# Patient Record
Sex: Female | Born: 1975 | Race: White | Hispanic: No | Marital: Married | Smoking: Former smoker
Health system: Southern US, Community
[De-identification: ages and names within clinical notes are randomized; demographics above are authoritative.]

## PROBLEM LIST (undated history)

## (undated) DIAGNOSIS — A389 Scarlet fever, uncomplicated: Secondary | ICD-10-CM

## (undated) DIAGNOSIS — G43909 Migraine, unspecified, not intractable, without status migrainosus: Secondary | ICD-10-CM

## (undated) HISTORY — PX: WRIST SURGERY: SHX841

## (undated) HISTORY — DX: Migraine, unspecified, not intractable, without status migrainosus: G43.909

## (undated) HISTORY — PX: WISDOM TOOTH EXTRACTION: SHX21

## (undated) HISTORY — PX: OTHER SURGICAL HISTORY: SHX169

## (undated) HISTORY — PX: SHOULDER SURGERY: SHX246

## (undated) HISTORY — PX: BREAST ENHANCEMENT SURGERY: SHX7

## (undated) HISTORY — PX: BACK SURGERY: SHX140

---

## 2008-07-09 HISTORY — PX: BACK SURGERY: SHX140

## 2009-03-10 ENCOUNTER — Ambulatory Visit (HOSPITAL_COMMUNITY): Admission: RE | Admit: 2009-03-10 | Discharge: 2009-03-11 | Payer: Self-pay | Admitting: Neurological Surgery

## 2010-10-13 LAB — CBC
HCT: 41.6 % (ref 36.0–46.0)
Hemoglobin: 14.3 g/dL (ref 12.0–15.0)
MCHC: 34.4 g/dL (ref 30.0–36.0)
MCV: 100.5 fL — ABNORMAL HIGH (ref 78.0–100.0)
RBC: 4.14 MIL/uL (ref 3.87–5.11)
WBC: 6.8 10*3/uL (ref 4.0–10.5)

## 2010-11-21 NOTE — Op Note (Signed)
NAME:  Cristina Anderson, Cristina Anderson               ACCOUNT NO.:  [id number]   MEDICAL RECORD NO.:  [id number]          PATIENT TYPE:  OIB   LOCATION:  3041                         FACILITY:  [facility]   PHYSICIAN:  Stefani Dama, M.D.  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   PROCEDURES:  Microdiskectomy via Metrix procedure at L4-L5 on the left  with operating microscope and microdissection technique.   SURGEON:  Stefani Dama, MD   FIRST ASSISTANT:  Cristi Loron, MD   ANESTHESIA:  General endotracheal.   INDICATIONS:  Ms. Jashawna is a 35 year old individual who has had a  number of months of unrelenting back and left lower extremity pain, then  she has developed some weakness in the tibialis anterior group and she  has evidence of a large extruded fragment of disk at L4-L5, that has a  fragment behind the body of L5.  She has been suffering with this  despite conservative efforts.  She is not getting any better and she was  advised regarding surgical decompression of disk at L4-L5 on the left  side.   PROCEDURE:  The patient was brought to the operating room supine on the  stretcher.  After smooth induction of general endotracheal anesthesia,  she was turned prone onto the operating table.  Back was prepped with  alcohol and DuraPrep and draped in a sterile fashion.  Fluoroscopic  guidance was used to localize the L4-L5 level on the right side and then  the skin above this area was infiltrated with a mixture of 1% lidocaine  with epinephrine 1:100,000 mixed 50/50 with 0.5% of Marcaine, a total of  10 mL volume was used.  A small vertical incision was created in the  skin and then a K-wire  was passed down to the level of the L4 laminar  arch on the right side.  Using a series of dilators, a winding technique  was then used to create the space at the L4-L5 interspace on the right  side out to the 18 mm diameter, 4 cm deep.  Endoscopic cannula was then  fitted in this region and the inner cannulas were removed.  The lamina  of L4 was then cleansed and removed using a high-speed drill and  electrocautery to maintain hemostasis.  The yellow ligament was found,  taken up with a 2 and 3 mm Kerrison punch.  Then, the medial aspect of  the superior articular process was removed from L5 and underneath this  the common dural tube and takeoff of the L5 nerve root was identified by  dissecting medial to this.  The nerve was noted to be taut over  significant mass and by moving it medially.  The mass could be  identified.  There were fragments of disk herniation.  These fragments  were removed  in a piecemeal fashion.  Several small to medium sized  fragments were removed and then gradually the nerve could be mobilized  further.  The diskectomy continued until the medial-most portion of this  could be identified and then opening into the disk space was identified  also.  The opening was enlarged slightly and there was noted to be a  moderate amount of significantly degenerated disk material within the  disk space.  This was then carefully dissected using a series of  curettes and rongeurs to free the disk both medially and laterally and  then went into the depths of the diskectomy void.  Once this was  completely finished and no further disk material could be obtained, the  area was irrigated copiously.  The integrity of the L5 nerve root and  the common dural tube was maintained and this was carefully checked for  hemostasis.  No CSF leaks had occurred with good hemostasis and the soft  tissues around the interspace and above the laminotomy site.  The  retractor was removed.  Microscope  was removed.  The lumbodorsal fascia  was secured with 3-0 Vicryl and 3-0 Vicryl was used on the subcutaneous  subcuticular tissues to close the skin.  Blood loss for the procedure  was estimated less than 20 mL.  The patient tolerated the procedure well  and was returned to the recovery room in stable condition.      Stefani Dama, M.D.  Electronically Signed     HJE/MEDQ  D:  03/10/2009  T:  03/11/2009  Job:  [id number]

## 2014-05-27 ENCOUNTER — Encounter (HOSPITAL_COMMUNITY): Payer: Self-pay | Admitting: *Deleted

## 2014-05-27 ENCOUNTER — Emergency Department (HOSPITAL_COMMUNITY): Payer: 59

## 2014-05-27 ENCOUNTER — Emergency Department (HOSPITAL_COMMUNITY)
Admission: EM | Admit: 2014-05-27 | Discharge: 2014-05-28 | Disposition: A | Discharge status: Home / Self Care | Payer: 59 | Attending: Emergency Medicine | Admitting: Emergency Medicine

## 2014-05-27 DIAGNOSIS — Z87891 Personal history of nicotine dependence: Secondary | ICD-10-CM | POA: Diagnosis not present

## 2014-05-27 DIAGNOSIS — R55 Syncope and collapse: Secondary | ICD-10-CM | POA: Diagnosis present

## 2014-05-27 DIAGNOSIS — R61 Generalized hyperhidrosis: Secondary | ICD-10-CM | POA: Insufficient documentation

## 2014-05-27 DIAGNOSIS — Z8619 Personal history of other infectious and parasitic diseases: Secondary | ICD-10-CM | POA: Insufficient documentation

## 2014-05-27 DIAGNOSIS — Z3202 Encounter for pregnancy test, result negative: Secondary | ICD-10-CM | POA: Insufficient documentation

## 2014-05-27 DIAGNOSIS — R079 Chest pain, unspecified: Secondary | ICD-10-CM

## 2014-05-27 HISTORY — DX: Scarlet fever, uncomplicated: A38.9

## 2014-05-27 LAB — CBC
HCT: 37.3 % (ref 36.0–46.0)
Hemoglobin: 12.5 g/dL (ref 12.0–15.0)
MCH: 32 pg (ref 26.0–34.0)
MCHC: 33.5 g/dL (ref 30.0–36.0)
MCV: 95.4 fL (ref 78.0–100.0)
PLATELETS: 158 10*3/uL (ref 150–400)
RBC: 3.91 MIL/uL (ref 3.87–5.11)
RDW: 12.4 % (ref 11.5–15.5)
WBC: 6.4 10*3/uL (ref 4.0–10.5)

## 2014-05-27 LAB — BASIC METABOLIC PANEL
ANION GAP: 16 — AB (ref 5–15)
BUN: 15 mg/dL (ref 6–23)
CALCIUM: 9.2 mg/dL (ref 8.4–10.5)
CHLORIDE: 103 meq/L (ref 96–112)
CO2: 22 meq/L (ref 19–32)
Creatinine, Ser: 0.83 mg/dL (ref 0.50–1.10)
GFR calc non Af Amer: 88 mL/min — ABNORMAL LOW (ref >90)
Glucose, Bld: 102 mg/dL — ABNORMAL HIGH (ref 70–99)
Potassium: 3.9 mEq/L (ref 3.7–5.3)
SODIUM: 141 meq/L (ref 137–147)

## 2014-05-27 LAB — URINALYSIS, ROUTINE W REFLEX MICROSCOPIC
Bilirubin Urine: NEGATIVE
Glucose, UA: NEGATIVE mg/dL
HGB URINE DIPSTICK: NEGATIVE
KETONES UR: NEGATIVE mg/dL
Leukocytes, UA: NEGATIVE
Nitrite: NEGATIVE
PROTEIN: NEGATIVE mg/dL
Specific Gravity, Urine: 1.021 (ref 1.005–1.030)
UROBILINOGEN UA: 0.2 mg/dL (ref 0.0–1.0)
pH: 6 (ref 5.0–8.0)

## 2014-05-27 LAB — CBG MONITORING, ED: Glucose-Capillary: 115 mg/dL — ABNORMAL HIGH (ref 70–99)

## 2014-05-27 LAB — PREGNANCY, URINE: Preg Test, Ur: NEGATIVE

## 2014-05-27 LAB — TROPONIN I

## 2014-05-27 NOTE — ED Notes (Addendum)
Pt states sent here from UC for near-sycope today while sitting at desk.  States dizziness lasting 3 - 4 hours with bil arm numbness and a flushed feeling.  States UC said her bp was 155 systolic.  Pt states her arms are no longer numb and she is no longer dizzy.   Also c/o intermittent L chest pain.

## 2014-05-27 NOTE — ED Notes (Signed)
Pt from home for c/o near syncopal episode while at work, pt states she got diaphoretic, sob and had bilateral arm numbness. Denies any symptoms at this time. nad noted. Pt axo x4.

## 2014-05-27 NOTE — Discharge Instructions (Signed)
Please follow-up with a primary care physician in the area-- resource guide attached. Return to the ED for new or worsening symptoms.   Emergency Department Resource Guide 1) Find a Doctor and Pay Out of Pocket Although you won't have to find out who is covered by your insurance plan, it is a good idea to ask around and get recommendations. You will then need to call the office and see if the doctor you have chosen will accept you as a new patient and what types of options they offer for patients who are self-pay. Some doctors offer discounts or will set up payment plans for their patients who do not have insurance, but you will need to ask so you aren't surprised when you get to your appointment.  2) Contact Your Local Health Department Not all health departments have doctors that can see patients for sick visits, but many do, so it is worth a call to see if yours does. If you don't know where your local health department is, you can check in your phone book. The CDC also has a tool to help you locate your state's health department, and many state websites also have listings of all of their local health departments.  3) Find a Walk-in Clinic If your illness is not likely to be very severe or complicated, you may want to try a walk in clinic. These are popping up all over the country in pharmacies, drugstores, and shopping centers. They're usually staffed by nurse practitioners or physician assistants that have been trained to treat common illnesses and complaints. They're usually fairly quick and inexpensive. However, if you have serious medical issues or chronic medical problems, these are probably not your best option.  No Primary Care Doctor: - Call Health Connect at  [phone] - they can help you locate a primary care doctor that  accepts your insurance, provides certain services, etc. - Physician Referral Service- [phone]  Chronic Pain Problems: Organization         Address  Phone    Notes  [facility]  [phone] Patients need to be referred by their primary care doctor.   Medication Assistance: Organization         Address  Phone   Notes  [facility] Medication [facility] [address] [place], [place] [address] [phone] --Must be a resident of [place] -- Must have NO insurance coverage whatsoever (no Medicaid/ Medicare, etc.) -- The pt. MUST have a primary care doctor that directs their care regularly and follows them in the community   MedAssist  [phone]   [employer]  [phone]    Agencies that provide inexpensive medical care: Organization         Address  Phone   Notes  [facility]  [phone]   [facility]    [phone]   [facility] [address] [place], [place] [address] [phone]   [facility] [address], [place] [phone]   [facility]anned [facility]    [phone]   [facility]    [phone]   [facility]  [address], [place] Phone:  [phone], Fax:  [phone]) [phone] Hours of Operation:  9 am - 6 pm, M-F.  Also accepts Medicaid/Medicare and self-pay.  [facility]  [address], [place] Phone: [phone],  Fax: [phone]. Hours of Operation:  8:30 am - 5:30 pm, M-F.  Also accepts Medicaid and self-pay.  [facility] [address], [place] Phone: [phone]   [place], [place], [place] [phone], Ext. [phone] Mondays & Thursdays: 7-9 AM.  First 15 patients are seen on a first come, first serve basis.    [place] Providers:  Organization         Address  Phone   Notes  [facility] [address],  [phone] Also accepts self-pay patients.  [facility]  [address], [place]  [phone]   [place], [address], [place] [phone]   [facility] [address], [place] [phone]   Lucianne Lei [address], [place]   [phone] Only accepts [place] Access [place] patients after they have their name applied to their card.   Self-Pay (no insurance) in [facility]:  Organization         Address  Phone   Notes  Sickle Cell Patients, [facility] [place] [phone]   [facility] [place] [phone]   [facility] [place]  [place], [place], [place] [phone]   [facility]/Dr. Osei-Bonsu  [address], [place] or [address], [place] [phone] Phone number for both [place] and [place] locations is the same.  [facility] [address], [place] [phone]   [facility] [address] [place] or [address] [phone]   [facility] [address] [place] [phone], phone; [phone], fax Sees patients 1st and 3rd Saturday of every month.  Must not qualify for public or private insurance (i.e. Medicaid, Medicare, [place] Health Choice, Veterans' Benefits)  Household income should be no more than 200% of the poverty level The clinic cannot treat you if you are pregnant or think you are pregnant  Sexually transmitted diseases are not treated at the clinic.    Dental Care: Organization         Address  Phone  Notes  [facility] Department of [facility] [phone] Accepts children up to age 22 who are enrolled in [place] or [place]; pregnant women with a Medicaid card; and children who have  applied for Medicaid or [place] Health Choice, but were declined, whose parents can pay a reduced fee at time of service.  [facility] Department of [facility]  [address], [place] [phone] Accepts children up to age 33 who are enrolled in [place] or [place]; pregnant women with a Medicaid card; and children who have applied for Medicaid or [place] Health Choice, but were declined, whose parents can pay a reduced fee at time of service.  [place] Adult Dental Access PROGRAM  [place] [phone] Patients are seen by appointment only. Walk-ins are not accepted. [place] will see patients 13 years of age and older. Monday - Tuesday (8am-5pm) Most Wednesdays (8:30-5pm) $30 per visit, cash only  [place] [facility] [employer] PROGRAM  [address], [employer] (  [phone] Patients are seen by appointment only. Walk-ins are not accepted. [place] will see patients 46 years of age and older. One Wednesday Evening (Monthly: Volunteer Based).  $30 per visit, cash only  [place]  [phone] for adults; Children under age 38, call Graduate Pediatric Dentistry at [phone]. Children aged 28-14, please call [phone] to request a pediatric application.  Dental services are provided in all areas of dental care including fillings, crowns and bridges, complete and partial dentures, implants, gum treatment, root canals, and extractions. Preventive care is also provided. Treatment is provided to both adults and children. Patients are selected via a lottery and there is often a waiting list.   [facility] [address] [place]  [phone] [email or web address]   [facility] [address] [place], [place] [phone], Ext. [phone] Second and Fourth Thursday of each month, opens at 6:30 AM; Clinic ends at 9 AM.  Patients are seen on a first-come first-served basis, and a  limited number are seen during each clinic.   [facility]  [address] Hill[place] D[place]er [place], [place] [phone]   Eligibility Requirements You must have lived in [place], [place], or [place] counties for at least the last three months.   You cannot be eligible for state or federal sponsored [employer], including [employer], [place], or [employer].   You generally cannot be eligible for healthcare insurance through your employer.    How to apply: Eligibility screenings are held every Tuesday and Wednesday afternoon from 1:00 pm until 4:00 pm. You do not need an appointment for the interview!  [facility] [address], [place], [place]   [place]  [place] Department  [place]  [phone]    Behavioral Health Resources in the Community: Intensive Outpatient Programs Organization         Address  Phone  Notes  [place]. [address], [place], [place] [phone]   [facility] [address] [place], [place]   ADS: [facility] Drug S[facility] [address] [place], [place]   [place] [address],  [place], [place] or [phone]   Substance Abuse Resources Organization         Address  Phone  Notes  Alcohol and Drug Services  [phone]   [place]  [phone]   [facility] [place]   Chinita Pester  [phone]   Residential & Outpatient Substance Abuse Program  [phone]   Psychological Services Organization         Address  Phone  Notes  [facility]  [place]  [phone]   [place] [address] [place] or [phone]    Mobile Crisis Teams Organization          Address  Phone  Notes  [facility], Mobile Crisis Care Unit  [phone]   [facility]  [address] [place], [place]   [place] Levels [address], [place] [phone]    Self-Help/Support Groups Organization         Address  Phone             Notes  [place]. of [place] - variety of support groups  336- H3156881 Call for more information  Narcotics Anonymous (NA), [facility] [address], [employer] [place]  2 meetings at this location   Residential Facilities manager         Address  Phone  Notes  [facility] [place],    [place]  [phone]   [facility]  [address], [place] [id number], [place], [place]   [place], [place] [phone] Admissions: 8am-3pm M-F  Incentives Substance [place] [address]    [employer], [place] [phone]   [facility] [address] [place], [place], [place]   [facility] [address],  [place], [place]   [facility] Programs - Intensive Outpatient [address], [place], [place]   [facility] ([place].,  [place], [place] [phone] or [phone]   [facility] Services (RTS) [address], [place], [place] Accepts Medicaid  [facility] [address]  [place] [phone] Substance Abuse/Addiction Treatment   [facility] Organization         Address  Phone  Notes  [facility]  [phone]   Domenic Schwab, PhD [address] Arlis Porta [place], [place]   [phone] or [phone]   [place], [place] [phone]   [facility] [address], [place], [place] [phone] Insurance/Medicaid/sponsorship  through [facility] and Families [address]                                    [place], [place] [phone] [place] [address], [place] [phone]    Dr. Adele Schilder  [phone]   [facility] Dept. 1) [address] [place] 2) [place] 3)  [place] [address], [place] [phone]  [phone]   [place] [phone] or [phone] (After Hours)

## 2014-05-27 NOTE — ED Provider Notes (Signed)
CSN: [id number]     Arrival date & time 05/27/14  1734 History   First MD Initiated Contact with Patient 05/27/14 2209     Chief Complaint  Patient presents with  . Near Syncope     (Consider location/radiation/quality/duration/timing/severity/associated sxs/prior Treatment) Patient is a 38 y.o. female presenting with near-syncope. The history is provided by the patient and medical records.  Near Syncope   This is a 38 year old female with past medical history significant for scarlet fever, worsening to the ED for near syncope. 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 States beginning at 9 AM she had an episode of bilateral arm paresthesias, flushed sensation, and lightheadedness. She did not lose consciousness. She ate a cupcake with improvement of her symptoms. States she had 2 other episodes of this similar symptoms earlier this afternoon. She was seen at an urgent care in [place] and encouraged to come to the ED for further evaluation. States she has been asymptomatic for the past several hours since arriving at the ED. Patient has had issues with hypoglycemia in the past with similar symptoms. She did not eat breakfast this morning and drank a monster energy drink.  She states she has had some intermittent left-sided chest pain over the past year.  When pain occurs, localized under her left breast, lasting 5-10 seconds at a time. She denies any episodes of chest pain, shortness of breath, numbness, weakness, dizziness, ataxia, visual disturbance, or changes in speech today.  Patient is not currently on any home medications. Vital signs stable on arrival.  Past Medical History  Diagnosis Date  . Scarlet fever    Past Surgical History  Procedure Laterality Date  . Back surgery      lumbar repair   No family history on file. History  Substance Use Topics  . Smoking status: Former Smoker    Types: Cigarettes  . Smokeless tobacco: Not on file  . Alcohol  Use: Yes   OB History    No data available     Review of Systems  Cardiovascular: Positive for near-syncope.  Neurological: Positive for light-headedness (resolved).       Paresthesias, resolved  All other systems reviewed and are negative.   Allergies  Mobic and Red dye  Home Medications   Prior to Admission medications   Not on File   BP 112/69 mmHg  Pulse 64  Temp(Src) 98.2 F (36.8 C) (Oral)  Resp 18  Ht 5\' 4"  (1.626 m)  Wt 137 lb (62.143 kg)  BMI 23.50 kg/m2  SpO2 100%  LMP 04/26/2014   Physical Exam  Constitutional: She is oriented to person, place, and time. She appears well-developed and well-nourished. No distress.  HENT:  Head: Normocephalic and atraumatic.  Mouth/Throat: Oropharynx is clear and moist.  Eyes: Conjunctivae and EOM are normal. Pupils are equal, round, and reactive to light.  Neck: Normal range of motion and full passive range of motion without pain. Neck supple. No spinous process tenderness present. No rigidity. No edema present.  No meningismus  Cardiovascular: Normal rate, regular rhythm and normal heart sounds.   Pulmonary/Chest: Effort normal and breath sounds normal. No respiratory distress. She has no wheezes.  Abdominal: Soft. Bowel sounds are normal. There is no tenderness. There is no guarding.  Musculoskeletal: Normal range of motion.       Cervical back: Normal.  CS non-tender, no deformities, full ROM  Neurological: She is alert and oriented to person, place, and time.  AAOx3,  answering questions appropriately; equal strength UE and LE bilaterally; CN grossly intact; moves all extremities appropriately without ataxia; normal coordination bilaterally; no focal neuro deficits or facial asymmetry appreciated; ambulating with steady gait  Skin: Skin is warm and dry. She is not diaphoretic.  Psychiatric: She has a normal mood and affect.  Nursing note and vitals reviewed.   ED Course  Procedures (including critical care  time) Labs Review Labs Reviewed  BASIC METABOLIC PANEL - Abnormal; Notable for the following:    Glucose, Bld 102 (*)    GFR calc non Af Amer 88 (*)    Anion gap 16 (*)    All other components within normal limits  CBG MONITORING, ED - Abnormal; Notable for the following:    Glucose-Capillary 115 (*)    All other components within normal limits  CBC  TROPONIN I  PREGNANCY, URINE  URINALYSIS, ROUTINE W REFLEX MICROSCOPIC    Imaging Review Dg Chest 2 View  05/27/2014   CLINICAL DATA:  Near syncope today. Dizziness and nausea. Bilateral arm numbness. Intermittent left chest pain. Shortness of breath.  EXAM: CHEST  2 VIEW  COMPARISON:  None.  FINDINGS: The heart size and mediastinal contours are within normal limits. Both lungs are clear. The visualized skeletal structures are unremarkable.  IMPRESSION: Normal exam.   Electronically Signed   By: Geanie CooleyJim  Maxwell M.D.   On: 05/27/2014 19:50     EKG Interpretation   Date/Time:  Thursday May 27 2014 18:17:52 EST Ventricular Rate:  87 PR Interval:  110 QRS Duration: 84 QT Interval:  390 QTC Calculation: 469 R Axis:   77 Text Interpretation:  Sinus rhythm with sinus arrhythmia with short PR  Nonspecific ST changes Abnormal ECG No old tracing to compare Confirmed by  WARD,  DO, KRISTEN ([id number]) on 05/27/2014 10:44:36 PM      MDM   Final diagnoses:  Near syncope   38 year old female with multiple episodes of near syncope earlier today at work associated with bilateral arm paresthesias, lightheadedness, and diaphoresis. She did not eat breakfast and drank a monster energy drink. Symptoms improved after eating a cupcake. On arrival, patient is currently asymptomatic. Her neurologic exam is nonfocal.  Neck is non-tender, no reported injuries and no deformities on exam.  No meningismus.  EKG NSR without ischemic changes.  Trop negative.  Lab work reassuring.  CXR clear.  No risk factors for PE, patient PERC negative.  Patient remains  asymptomatic while in the ED and is ready to go home, feel this is appropriate. VS remain stable.  Low suspicion for neurologic or cardiac etiology of her syncope. Possible episodic hypoglycemia given that she did not eat breakfast and that symptoms improved after eating a cupcake. Encouraged close FU with PCP in the area, resource guide given.  Discussed plan with patient, he/she acknowledged understanding and agreed with plan of care.  Return precautions given for new or worsening symptoms.  Case discussed with attending physician, Dr. Elesa MassedWard, who agrees with assessment and plan of care.  Garlon HatchetLisa M Frimet Durfee, PA-C 05/27/14 2357  Garlon HatchetLisa M Marcey Persad, PA-C 05/28/14 0001  Layla MawKristen N Ward, DO 05/28/14 1555

## 2015-01-23 ENCOUNTER — Emergency Department (HOSPITAL_COMMUNITY)
Admission: EM | Admit: 2015-01-23 | Discharge: 2015-01-23 | Disposition: A | Discharge status: Home / Self Care | Payer: BLUE CROSS/BLUE SHIELD | Attending: Emergency Medicine | Admitting: Emergency Medicine

## 2015-01-23 ENCOUNTER — Emergency Department (HOSPITAL_COMMUNITY): Payer: BLUE CROSS/BLUE SHIELD

## 2015-01-23 DIAGNOSIS — R102 Pelvic and perineal pain unspecified side: Secondary | ICD-10-CM

## 2015-01-23 DIAGNOSIS — Z3202 Encounter for pregnancy test, result negative: Secondary | ICD-10-CM | POA: Insufficient documentation

## 2015-01-23 DIAGNOSIS — Z87891 Personal history of nicotine dependence: Secondary | ICD-10-CM | POA: Diagnosis not present

## 2015-01-23 DIAGNOSIS — Z8619 Personal history of other infectious and parasitic diseases: Secondary | ICD-10-CM | POA: Diagnosis not present

## 2015-01-23 DIAGNOSIS — R109 Unspecified abdominal pain: Secondary | ICD-10-CM

## 2015-01-23 DIAGNOSIS — R1011 Right upper quadrant pain: Secondary | ICD-10-CM | POA: Diagnosis present

## 2015-01-23 LAB — COMPREHENSIVE METABOLIC PANEL
ALBUMIN: 4.2 g/dL (ref 3.5–5.0)
ALK PHOS: 39 U/L (ref 38–126)
ALT: 12 U/L — ABNORMAL LOW (ref 14–54)
ANION GAP: 7 (ref 5–15)
AST: 19 U/L (ref 15–41)
BILIRUBIN TOTAL: 0.4 mg/dL (ref 0.3–1.2)
BUN: 18 mg/dL (ref 6–20)
CHLORIDE: 107 mmol/L (ref 101–111)
CO2: 25 mmol/L (ref 22–32)
CREATININE: 0.89 mg/dL (ref 0.44–1.00)
Calcium: 8.9 mg/dL (ref 8.9–10.3)
GFR calc non Af Amer: >60 mL/min (ref >60)
Glucose, Bld: 109 mg/dL — ABNORMAL HIGH (ref 65–99)
Potassium: 4 mmol/L (ref 3.5–5.1)
Sodium: 139 mmol/L (ref 135–145)
Total Protein: 7.4 g/dL (ref 6.5–8.1)

## 2015-01-23 LAB — CBC
HCT: 34.9 % — ABNORMAL LOW (ref 36.0–46.0)
Hemoglobin: 11.7 g/dL — ABNORMAL LOW (ref 12.0–15.0)
MCH: 33 pg (ref 26.0–34.0)
MCHC: 33.5 g/dL (ref 30.0–36.0)
MCV: 98.3 fL (ref 78.0–100.0)
PLATELETS: 154 10*3/uL (ref 150–400)
RBC: 3.55 MIL/uL — ABNORMAL LOW (ref 3.87–5.11)
RDW: 12.8 % (ref 11.5–15.5)
WBC: 9.7 10*3/uL (ref 4.0–10.5)

## 2015-01-23 LAB — URINALYSIS, ROUTINE W REFLEX MICROSCOPIC
Bilirubin Urine: NEGATIVE
GLUCOSE, UA: NEGATIVE mg/dL
HGB URINE DIPSTICK: NEGATIVE
KETONES UR: NEGATIVE mg/dL
LEUKOCYTES UA: NEGATIVE
NITRITE: NEGATIVE
PROTEIN: NEGATIVE mg/dL
SPECIFIC GRAVITY, URINE: 1.019 (ref 1.005–1.030)
Urobilinogen, UA: 0.2 mg/dL (ref 0.0–1.0)
pH: 6 (ref 5.0–8.0)

## 2015-01-23 LAB — I-STAT BETA HCG BLOOD, ED (MC, WL, AP ONLY): I-stat hCG, quantitative: <5 m[IU]/mL (ref <5)

## 2015-01-23 LAB — LIPASE, BLOOD: LIPASE: 20 U/L — AB (ref 22–51)

## 2015-01-23 MED ORDER — SODIUM CHLORIDE 0.9 % IV SOLN: INTRAVENOUS | Status: DC

## 2015-01-23 MED ORDER — HYDROMORPHONE HCL 1 MG/ML IJ SOLN
1.0000 mg | Freq: Once | INTRAMUSCULAR | Status: AC
  Administered 2015-01-23: 1 mg via INTRAVENOUS
  Filled 2015-01-23: qty 1

## 2015-01-23 MED ORDER — OXYCODONE-ACETAMINOPHEN 5-325 MG PO TABS: 1.0000 | ORAL_TABLET | ORAL | Status: DC | PRN

## 2015-01-23 MED ORDER — ONDANSETRON HCL 4 MG/2ML IJ SOLN
4.0000 mg | Freq: Once | INTRAMUSCULAR | Status: AC
  Administered 2015-01-23: 4 mg via INTRAVENOUS
  Filled 2015-01-23: qty 2

## 2015-01-23 MED ORDER — SODIUM CHLORIDE 0.9 % IV BOLUS (SEPSIS)
1000.0000 mL | Freq: Once | INTRAVENOUS | Status: AC
  Administered 2015-01-23: 1000 mL via INTRAVENOUS

## 2015-01-23 MED ORDER — IOHEXOL 300 MG/ML  SOLN
50.0000 mL | Freq: Once | INTRAMUSCULAR | Status: AC | PRN
  Administered 2015-01-23: 50 mL via ORAL

## 2015-01-23 MED ORDER — IOHEXOL 300 MG/ML  SOLN
100.0000 mL | Freq: Once | INTRAMUSCULAR | Status: AC | PRN
  Administered 2015-01-23: 100 mL via INTRAVENOUS

## 2015-01-23 NOTE — ED Notes (Signed)
Patient transported to Ultrasound 

## 2015-01-23 NOTE — Discharge Instructions (Signed)
You had a cyst on your ovary today that ruptured and has caused bleeding. You will need to be seen by a gynecologist within the next 4 weeks. Go to [facility] if you become dizzy or lightheaded or if the pain becomes worse Ovarian Cyst An ovarian cyst is a fluid-filled sac that forms on an ovary. The ovaries are small organs that produce eggs in women. Various types of cysts can form on the ovaries. Most are not cancerous. Many do not cause problems, and they often go away on their own. Some may cause symptoms and require treatment. Common types of ovarian cysts include:  Functional cysts--These cysts may occur every month during the menstrual cycle. This is normal. The cysts usually go away with the next menstrual cycle if the woman does not get pregnant. Usually, there are no symptoms with a functional cyst.  Endometrioma cysts--These cysts form from the tissue that lines the uterus. They are also called "chocolate cysts" because they become filled with blood that turns brown. This type of cyst can cause pain in the lower abdomen during intercourse and with your menstrual period.  Cystadenoma cysts--This type develops from the cells on the outside of the ovary. These cysts can get very big and cause lower abdomen pain and pain with intercourse. This type of cyst can twist on itself, cut off its blood supply, and cause severe pain. It can also easily rupture and cause a lot of pain.  Dermoid cysts--This type of cyst is sometimes found in both ovaries. These cysts may contain different kinds of body tissue, such as skin, teeth, hair, or cartilage. They usually do not cause symptoms unless they get very big.  Theca lutein cysts--These cysts occur when too much of a certain hormone (human chorionic gonadotropin) is produced and overstimulates the ovaries to produce an egg. This is most common after procedures used to assist with the conception of a baby (in vitro fertilization). CAUSES   Fertility  drugs can cause a condition in which multiple large cysts are formed on the ovaries. This is called ovarian hyperstimulation syndrome.  A condition called polycystic ovary syndrome can cause hormonal imbalances that can lead to nonfunctional ovarian cysts. SIGNS AND SYMPTOMS  Many ovarian cysts do not cause symptoms. If symptoms are present, they may include:  Pelvic pain or pressure.  Pain in the lower abdomen.  Pain during sexual intercourse.  Increasing girth (swelling) of the abdomen.  Abnormal menstrual periods.  Increasing pain with menstrual periods.  Stopping having menstrual periods without being pregnant. DIAGNOSIS  These cysts are commonly found during a routine or annual pelvic exam. Tests may be ordered to find out more about the cyst. These tests may include:  Ultrasound.  X-ray of the pelvis.  CT scan.  MRI.  Blood tests. TREATMENT  Many ovarian cysts go away on their own without treatment. Your health care provider may want to check your cyst regularly for 2-3 months to see if it changes. For women in menopause, it is particularly important to monitor a cyst closely because of the higher rate of ovarian cancer in menopausal women. When treatment is needed, it may include any of the following:  A procedure to drain the cyst (aspiration). This may be done using a long needle and ultrasound. It can also be done through a laparoscopic procedure. This involves using a thin, lighted tube with a tiny camera on the end (laparoscope) inserted through a small incision.  Surgery to remove the whole cyst.  This may be done using laparoscopic surgery or an open surgery involving a larger incision in the lower abdomen.  Hormone treatment or birth control pills. These methods are sometimes used to help dissolve a cyst. HOME CARE INSTRUCTIONS   Only take over-the-counter or prescription medicines as directed by your health care provider.  Follow up with your health care  provider as directed.  Get regular pelvic exams and Pap tests. SEEK MEDICAL CARE IF:   Your periods are late, irregular, or painful, or they stop.  Your pelvic pain or abdominal pain does not go away.  Your abdomen becomes larger or swollen.  You have pressure on your bladder or trouble emptying your bladder completely.  You have pain during sexual intercourse.  You have feelings of fullness, pressure, or discomfort in your stomach.  You lose weight for no apparent reason.  You feel generally ill.  You become constipated.  You lose your appetite.  You develop acne.  You have an increase in body and facial hair.  You are gaining weight, without changing your exercise and eating habits.  You think you are pregnant. SEEK IMMEDIATE MEDICAL CARE IF:   You have increasing abdominal pain.  You feel sick to your stomach (nauseous), and you throw up (vomit).  You develop a fever that comes on suddenly.  You have abdominal pain during a bowel movement.  Your menstrual periods become heavier than usual. MAKE SURE YOU:  Understand these instructions.  Will watch your condition.  Will get help right away if you are not doing well or get worse. Document Released: 06/25/2005 Document Revised: 06/30/2013 Document Reviewed: 03/02/2013 [facility] Patient Information 2015 [place], [place]. This information is not intended to replace advice given to you by your health care provider. Make sure you discuss any questions you have with your health care provider.

## 2015-01-23 NOTE — ED Provider Notes (Addendum)
CSN: [id number]     Arrival date & time 01/23/15  7829 History   First MD Initiated Contact with Patient 01/23/15 [phone]     Chief Complaint  Patient presents with  . Abdominal Pain     (Consider location/radiation/quality/duration/timing/severity/associated sxs/prior Treatment) HPI Comments: Patient here complaining of 24 hours of abdominal pain that started her right upper quadrant that is now migrated to her right lower quadrant. No associated fever or chills. No dysuria or hematuria. No vaginal bleeding or discharge. Took antiacids without relief. No prior history of same. Pain characterized as sharp and worse with movements. Patient states that she said down the toilet she tried to move her bowels the pain was so severe that she had a brief syncopal episode. Denies any blood in her stools. Patient states that she feels as if she can pass gas she will feel better. Last menstrual period was 2 weeks ago. Pain is not colicky.  Patient is a 39 y.o. female presenting with abdominal pain. The history is provided by the patient and the spouse.  Abdominal Pain   Past Medical History  Diagnosis Date  . Scarlet fever    Past Surgical History  Procedure Laterality Date  . Back surgery      lumbar repair   No family history on file. History  Substance Use Topics  . Smoking status: Former Smoker    Types: Cigarettes  . Smokeless tobacco: Not on file  . Alcohol Use: Yes   OB History    No data available     Review of Systems  Gastrointestinal: Positive for abdominal pain.  All other systems reviewed and are negative.     Allergies  Mobic and Red dye  Home Medications   Prior to Admission medications   Medication Sig Start Date End Date Taking? Authorizing Provider  acidophilus (RISAQUAD) CAPS capsule Take 1 capsule by mouth daily.   Yes Historical Provider, MD  Multiple Vitamin (MULTIVITAMIN WITH MINERALS) TABS tablet Take 1 tablet by mouth daily.   Yes Historical Provider, MD   simethicone (MYLICON) 80 MG chewable tablet Chew 80 mg by mouth every 6 (six) hours as needed for flatulence.   Yes Historical Provider, MD   BP 134/86 mmHg  Pulse 89  Temp(Src) 97.7 F (36.5 C) (Oral)  Resp 18  Ht  (1.626 m)  Wt 138 lb 6.4 oz (62.778 kg)  BMI 23.74 kg/m2  SpO2 100%  LMP 01/09/2015 (Approximate) Physical Exam  Constitutional: She is oriented to person, place, and time. She appears well-developed and well-nourished.  Non-toxic appearance. No distress.  HENT:  Head: Normocephalic and atraumatic.  Eyes: Conjunctivae, EOM and lids are normal. Pupils are equal, round, and reactive to light.  Neck: Normal range of motion. Neck supple. No tracheal deviation present. No thyroid mass present.  Cardiovascular: Normal rate, regular rhythm and normal heart sounds.  Exam reveals no gallop.   No murmur heard. Pulmonary/Chest: Effort normal and breath sounds normal. No stridor. No respiratory distress. She has no decreased breath sounds. She has no wheezes. She has no rhonchi. She has no rales.  Abdominal: Soft. Normal appearance and bowel sounds are normal. She exhibits no distension. There is tenderness in the right lower quadrant and epigastric area. There is no rigidity, no rebound, no guarding and no CVA tenderness.    Musculoskeletal: Normal range of motion. She exhibits no edema or tenderness.  Neurological: She is alert and oriented to person, place, and time. She has normal strength. No  cranial nerve deficit or sensory deficit. GCS eye subscore is 4. GCS verbal subscore is 5. GCS motor subscore is 6.  Skin: Skin is warm and dry. No abrasion and no rash noted.  Psychiatric: She has a normal mood and affect. Her speech is normal and behavior is normal.  Nursing note and vitals reviewed.   ED Course  Procedures (including critical care time) Labs Review Labs Reviewed  LIPASE, BLOOD - Abnormal; Notable for the following:    Lipase 20 (*)    All other components  within normal limits  COMPREHENSIVE METABOLIC PANEL - Abnormal; Notable for the following:    Glucose, Bld 109 (*)    ALT 12 (*)    All other components within normal limits  CBC - Abnormal; Notable for the following:    RBC 3.55 (*)    Hemoglobin 11.7 (*)    HCT 34.9 (*)    All other components within normal limits  URINALYSIS, ROUTINE W REFLEX MICROSCOPIC (NOT AT [facility]) - Abnormal; Notable for the following:    APPearance CLOUDY (*)    All other components within normal limits    Imaging Review No results found.   EKG Interpretation None      MDM   Final diagnoses:  Abdominal pain    Patient given IV pain meds here 2 and feels better. She deferred her pelvic exam. Patient's imaging results noted and she will follow-up with her gynecologist. Patient's hemoglobin is 11.7. Blood pressure has been stable here.  Lorre NickAnthony Garnet Chatmon, MD 01/23/15 1230  Lorre NickAnthony Ariv Penrod, MD 01/23/15 1231  Lorre NickAnthony Gennett Garcia, MD 01/23/15 [phone]

## 2015-01-23 NOTE — ED Notes (Signed)
Patient transported to CT 

## 2015-01-23 NOTE — ED Notes (Signed)
MD at bedside. 

## 2015-01-23 NOTE — ED Notes (Signed)
Pt arrived to the ED with a complaint of abdominal and pelvis pain.  Pt has had pain for 24 hours.  Pt describes the pain as a pressure and bloating feeling.  Pt states it is painful to sit.  Pt states she is urinating, having bowel movements and having flatus but when she attempted to have a bowel movement earlier this am she had LOC.

## 2015-02-10 ENCOUNTER — Inpatient Hospital Stay (HOSPITAL_COMMUNITY): Payer: BLUE CROSS/BLUE SHIELD

## 2015-02-10 ENCOUNTER — Inpatient Hospital Stay (HOSPITAL_COMMUNITY)
Admission: AD | Admit: 2015-02-10 | Discharge: 2015-02-10 | Disposition: A | Discharge status: Home / Self Care | Payer: BLUE CROSS/BLUE SHIELD | Source: Ambulatory Visit | Attending: Obstetrics and Gynecology | Admitting: Obstetrics and Gynecology

## 2015-02-10 ENCOUNTER — Encounter (HOSPITAL_COMMUNITY): Payer: Self-pay | Admitting: *Deleted

## 2015-02-10 DIAGNOSIS — K661 Hemoperitoneum: Secondary | ICD-10-CM | POA: Insufficient documentation

## 2015-02-10 DIAGNOSIS — F17210 Nicotine dependence, cigarettes, uncomplicated: Secondary | ICD-10-CM | POA: Diagnosis not present

## 2015-02-10 DIAGNOSIS — R1032 Left lower quadrant pain: Secondary | ICD-10-CM | POA: Diagnosis present

## 2015-02-10 DIAGNOSIS — N8320 Unspecified ovarian cysts: Secondary | ICD-10-CM | POA: Diagnosis not present

## 2015-02-10 DIAGNOSIS — N83209 Unspecified ovarian cyst, unspecified side: Secondary | ICD-10-CM | POA: Diagnosis not present

## 2015-02-10 DIAGNOSIS — K668 Other specified disorders of peritoneum: Secondary | ICD-10-CM | POA: Diagnosis not present

## 2015-02-10 LAB — URINALYSIS, ROUTINE W REFLEX MICROSCOPIC
BILIRUBIN URINE: NEGATIVE
GLUCOSE, UA: NEGATIVE mg/dL
Hgb urine dipstick: NEGATIVE
KETONES UR: NEGATIVE mg/dL
Leukocytes, UA: NEGATIVE
Nitrite: NEGATIVE
Protein, ur: NEGATIVE mg/dL
Specific Gravity, Urine: 1.01 (ref 1.005–1.030)
Urobilinogen, UA: 0.2 mg/dL (ref 0.0–1.0)
pH: 5.5 (ref 5.0–8.0)

## 2015-02-10 LAB — CBC
HEMATOCRIT: 36.7 % (ref 36.0–46.0)
Hemoglobin: 12.2 g/dL (ref 12.0–15.0)
MCH: 32.9 pg (ref 26.0–34.0)
MCHC: 33.2 g/dL (ref 30.0–36.0)
MCV: 98.9 fL (ref 78.0–100.0)
Platelets: 158 10*3/uL (ref 150–400)
RBC: 3.71 MIL/uL — AB (ref 3.87–5.11)
RDW: 13.2 % (ref 11.5–15.5)
WBC: 6.7 10*3/uL (ref 4.0–10.5)

## 2015-02-10 LAB — WET PREP, GENITAL
Trich, Wet Prep: NONE SEEN
Yeast Wet Prep HPF POC: NONE SEEN

## 2015-02-10 LAB — POCT PREGNANCY, URINE: Preg Test, Ur: NEGATIVE

## 2015-02-10 MED ORDER — KETOROLAC TROMETHAMINE 10 MG PO TABS: 10.0000 mg | ORAL_TABLET | Freq: Four times a day (QID) | ORAL | Status: DC | PRN

## 2015-02-10 MED ORDER — OXYCODONE-ACETAMINOPHEN 5-325 MG PO TABS: 1.0000 | ORAL_TABLET | ORAL | Status: DC | PRN

## 2015-02-10 MED ORDER — METRONIDAZOLE 500 MG PO TABS: 500.0000 mg | ORAL_TABLET | Freq: Two times a day (BID) | ORAL | Status: AC

## 2015-02-10 MED ORDER — KETOROLAC TROMETHAMINE 30 MG/ML IJ SOLN
30.0000 mg | Freq: Once | INTRAMUSCULAR | Status: AC
  Administered 2015-02-10: 30 mg via INTRAMUSCULAR
  Filled 2015-02-10: qty 1

## 2015-02-10 NOTE — Discharge Instructions (Signed)
Ovarian Cyst An ovarian cyst is a fluid-filled sac that forms on an ovary. The ovaries are small organs that produce eggs in women. Various types of cysts can form on the ovaries. Most are not cancerous. Many do not cause problems, and they often go away on their own. Some may cause symptoms and require treatment. Common types of ovarian cysts include:  Functional cysts--These cysts may occur every month during the menstrual cycle. This is normal. The cysts usually go away with the next menstrual cycle if the woman does not get pregnant. Usually, there are no symptoms with a functional cyst.  Endometrioma cysts--These cysts form from the tissue that lines the uterus. They are also called "chocolate cysts" because they become filled with blood that turns brown. This type of cyst can cause pain in the lower abdomen during intercourse and with your menstrual period.  Cystadenoma cysts--This type develops from the cells on the outside of the ovary. These cysts can get very big and cause lower abdomen pain and pain with intercourse. This type of cyst can twist on itself, cut off its blood supply, and cause severe pain. It can also easily rupture and cause a lot of pain.  Dermoid cysts--This type of cyst is sometimes found in both ovaries. These cysts may contain different kinds of body tissue, such as skin, teeth, hair, or cartilage. They usually do not cause symptoms unless they get very big.  Theca lutein cysts--These cysts occur when too much of a certain hormone (human chorionic gonadotropin) is produced and overstimulates the ovaries to produce an egg. This is most common after procedures used to assist with the conception of a baby (in vitro fertilization). CAUSES   Fertility drugs can cause a condition in which multiple large cysts are formed on the ovaries. This is called ovarian hyperstimulation syndrome.  A condition called polycystic ovary syndrome can cause hormonal imbalances that can lead to  nonfunctional ovarian cysts. SIGNS AND SYMPTOMS  Many ovarian cysts do not cause symptoms. If symptoms are present, they may include:  Pelvic pain or pressure.  Pain in the lower abdomen.  Pain during sexual intercourse.  Increasing girth (swelling) of the abdomen.  Abnormal menstrual periods.  Increasing pain with menstrual periods.  Stopping having menstrual periods without being pregnant. DIAGNOSIS  These cysts are commonly found during a routine or annual pelvic exam. Tests may be ordered to find out more about the cyst. These tests may include:  Ultrasound.  X-ray of the pelvis.  CT scan.  MRI.  Blood tests. TREATMENT  Many ovarian cysts go away on their own without treatment. Your health care provider may want to check your cyst regularly for 2-3 months to see if it changes. For women in menopause, it is particularly important to monitor a cyst closely because of the higher rate of ovarian cancer in menopausal women. When treatment is needed, it may include any of the following:  A procedure to drain the cyst (aspiration). This may be done using a long needle and ultrasound. It can also be done through a laparoscopic procedure. This involves using a thin, lighted tube with a tiny camera on the end (laparoscope) inserted through a small incision.  Surgery to remove the whole cyst. This may be done using laparoscopic surgery or an open surgery involving a larger incision in the lower abdomen.  Hormone treatment or birth control pills. These methods are sometimes used to help dissolve a cyst. HOME CARE INSTRUCTIONS   Only take over-the-counter   or prescription medicines as directed by your health care provider.  Follow up with your health care provider as directed.  Get regular pelvic exams and Pap tests. SEEK MEDICAL CARE IF:   Your periods are late, irregular, or painful, or they stop.  Your pelvic pain or abdominal pain does not go away.  Your abdomen becomes  larger or swollen.  You have pressure on your bladder or trouble emptying your bladder completely.  You have pain during sexual intercourse.  You have feelings of fullness, pressure, or discomfort in your stomach.  You lose weight for no apparent reason.  You feel generally ill.  You become constipated.  You lose your appetite.  You develop acne.  You have an increase in body and facial hair.  You are gaining weight, without changing your exercise and eating habits.  You think you are pregnant. SEEK IMMEDIATE MEDICAL CARE IF:   You have increasing abdominal pain.  You feel sick to your stomach (nauseous), and you throw up (vomit).  You develop a fever that comes on suddenly.  You have abdominal pain during a bowel movement.  Your menstrual periods become heavier than usual. MAKE SURE YOU:  Understand these instructions.  Will watch your condition.  Will get help right away if you are not doing well or get worse. Document Released: 06/25/2005 Document Revised: 06/30/2013 Document Reviewed: 03/02/2013 [employer] Patient Information 2015 [employer]. This information is not intended to replace advice given to you by your health care provider. Make sure you discuss any questions you have with your health care provider.  

## 2015-02-10 NOTE — MAU Provider Note (Signed)
History   39 yo G1P0010 presented after calling office with worsening pain in LLQ.  Seen at ER 01/23/15 for right side pain.  Hgb 11.7, imaging as as below, negative UPT and UA.  Given IV pain meds with benefit, plan made for f/u at gyn office.  Pain was better for several days after ER visit, now worse since several days ago.  Denies N/V/diarrhea, fever, constipation.  Took Percocet without benefit.  No dysuria, does have some increase in pain with voiding and BM.  01/23/15:   PELVIC US: 1. Complex right ovarian mass measures 2.4 cm. The appearance favors a benign hemorrhagic cyst which likely accounts for the moderate hemoperitoneum. 2. Followup ultrasound is recommended in 8-12 weeks. 3. Left ovary has a normal appearance. 4. Mildly heterogeneous endometrium without focal mass.  CT: 1. There is moderate hemoperitoneum identified within the abdomen and pelvis. In the absence of known trauma this is favored to represent sequelae of ruptured ovarian cyst. Suggest further evaluation with pelvic sonogram. In the setting of trauma this would be concerning for solid organ injury.  Seen at [facility] on 7/26 by Dr. Sallye Ober as a new patient, for f/u on dx of cyst.  Plan made for repeat US 04/04/15.   Patient Active Problem List   Diagnosis Date Noted  . Hemorrhagic cyst of ovary--right side 01/23/15 02/10/2015  . Pneumoperitoneum on [place] 01/23/15 02/10/2015    Chief Complaint  Patient presents with  . Abdominal Pain   HPI  OB History    Gravida Para Term Preterm AB TAB SAB Ectopic Multiple Living   1    1 1           Past Medical History  Diagnosis Date  . Scarlet fever     Past Surgical History  Procedure Laterality Date  . Back surgery      lumbar repair    History reviewed. No pertinent family history.  History  Substance Use Topics  . Smoking status: Current Every Day Smoker    Types: Cigarettes, E-cigarettes  . Smokeless tobacco: Not on file  . Alcohol Use: Yes     Allergies:  Allergies  Allergen Reactions  . Mobic [Meloxicam] Swelling    Extreme joint swelling CAN TAKE IBUPROPHEN  . Red Dye Anaphylaxis, Hives and Swelling    Hand and feet swelling    Prescriptions prior to admission  Medication Sig Dispense Refill Last Dose  . acetaminophen (TYLENOL) 500 MG tablet Take 1,000 mg by mouth every 6 (six) hours as needed.   02/10/2015 at Unknown time  . acidophilus (RISAQUAD) CAPS capsule Take 1 capsule by mouth daily.   Past Week at Unknown time  . BIOTIN PO Take 1 tablet by mouth daily.   Past Week at Unknown time  . diphenhydrAMINE (BENADRYL) spray Apply 1 spray topically every 4 (four) hours as needed for itching.   Past Week at Unknown time  . MAGNESIUM PO Take 1 tablet by mouth daily.   Past Week at Unknown time  . Multiple Vitamin (MULTIVITAMIN WITH MINERALS) TABS tablet Take 1 tablet by mouth daily.   Past Week at Unknown time  . Multiple Vitamins-Minerals (HAIR SKIN AND NAILS FORMULA PO) Take 1 tablet by mouth daily.   Past Week at Unknown time  . oxyCODONE-acetaminophen (PERCOCET/ROXICET) 5-325 MG per tablet Take 1-2 tablets by mouth every 4 (four) hours as needed for moderate pain or severe pain. 20 tablet 0 02/09/2015 at Unknown time  . phenazopyridine (PYRIDIUM) 95 MG tablet Take  95 mg by mouth 3 (three) times daily as needed for pain.   02/09/2015 at Unknown time    ROS:  LLQ pain Physical Exam   Blood pressure 131/86, pulse 78, temperature 98.2 F (36.8 C), temperature source Oral, resp. rate 18, height  (1.626 m), weight 62.143 kg (137 lb), last menstrual period 01/02/2015.    Physical Exam  In moderate distress with LLQ pain Chest clear Heart RRR without murmur Abd--+ rebound and guarding from midline to LLQ.  No pain on right side to palpation. Pelvic--small amount white d/c in vault, pain in cul-de-sac during speculum exam, none on pelvic.  Cervix closed, NT Ext WNL   ED Course  Assessment: LLQ pain--hx right  hemorrhagic cyst  Plan: Consulted with Dr. Normand Sloop. CBC, wet prep, GC/chlamydia, UA, UPT Pelvic US Toradol 30 mg IM (patient able to take Ibuprophen)  Feleshia Zundel CNM, MSN 02/10/2015 1:28 PM  Addendum: Returned from [place]:  Other findings: Small free pelvic fluid with minimal septation in the low pelvis, likely continued resolution of previously seen hemoperitoneum.  IMPRESSION: 1. No acute finding. 2. Resolved right ovarian hemorrhagic cyst. 3. Small volume pelvic fluid.  Results for orders placed or performed during the hospital encounter of 02/10/15 (from the past 24 hour(s))  Urinalysis, Routine w reflex microscopic (not at [facility])     Status: None   Collection Time: 02/10/15 11:28 AM  Result Value Ref Range   Color, Urine YELLOW YELLOW   APPearance CLEAR CLEAR   Specific Gravity, Urine 1.010 1.005 - 1.030   pH 5.5 5.0 - 8.0   Glucose, UA NEGATIVE NEGATIVE mg/dL   Hgb urine dipstick NEGATIVE NEGATIVE   Bilirubin Urine NEGATIVE NEGATIVE   Ketones, ur NEGATIVE NEGATIVE mg/dL   Protein, ur NEGATIVE NEGATIVE mg/dL   Urobilinogen, UA 0.2 0.0 - 1.0 mg/dL   Nitrite NEGATIVE NEGATIVE   Leukocytes, UA NEGATIVE NEGATIVE  Pregnancy, urine POC     Status: None   Collection Time: 02/10/15 11:30 AM  Result Value Ref Range   Preg Test, Ur NEGATIVE NEGATIVE  CBC     Status: Abnormal   Collection Time: 02/10/15 11:50 AM  Result Value Ref Range   WBC 6.7 4.0 - 10.5 K/uL   RBC 3.71 (L) 3.87 - 5.11 MIL/uL   Hemoglobin 12.2 12.0 - 15.0 g/dL   HCT 16.1 09.6 - 04.5 %   MCV 98.9 78.0 - 100.0 fL   MCH 32.9 26.0 - 34.0 pg   MCHC 33.2 30.0 - 36.0 g/dL   RDW 40.9 81.1 - 91.4 %   Platelets 158 150 - 400 K/uL  Wet prep, genital     Status: Abnormal   Collection Time: 02/10/15 11:50 AM  Result Value Ref Range   Yeast Wet Prep HPF POC NONE SEEN NONE SEEN   Trich, Wet Prep NONE SEEN NONE SEEN   Clue Cells Wet Prep HPF POC MANY (A) NONE SEEN   WBC, Wet Prep HPF POC FEW (A) NONE SEEN     Consulted with Dr. Normand Sloop. Ovarian cyst resolved, only small amount fluid in pelvis now.  Plan: D/C home Continue pain med use Refill on Percocet 1-2, #36, no refills Rx Toradol 10 mg po QID prn x 5 days. Keep f/u visit in office as scheduled for 04/04/15, or prn.  To call on Monday if not feeling better. Repeat CBC at [place] due to platelets 158. OOW this weekend.  Nigel Bridgeman, CNM 02/10/15 1:50p

## 2015-02-10 NOTE — MAU Note (Signed)
Pt presents complaining of lower left abdominal pain and pain with urination. States it feels like something is trying to come out of her vagina when she has a bowel movement. Hx of cyst.

## 2015-02-11 LAB — GC/CHLAMYDIA PROBE AMP (~~LOC~~) NOT AT ARMC
Chlamydia: NEGATIVE
Neisseria Gonorrhea: NEGATIVE

## 2015-04-11 ENCOUNTER — Ambulatory Visit: Payer: BLUE CROSS/BLUE SHIELD | Admitting: Cardiology

## 2015-05-05 ENCOUNTER — Ambulatory Visit (INDEPENDENT_AMBULATORY_CARE_PROVIDER_SITE_OTHER): Payer: BLUE CROSS/BLUE SHIELD | Admitting: Cardiology

## 2015-05-05 VITALS — BP 118/72 | HR 90 | Ht 64.0 in | Wt 135.7 lb

## 2015-05-05 DIAGNOSIS — R0789 Other chest pain: Secondary | ICD-10-CM | POA: Diagnosis not present

## 2015-05-05 NOTE — Patient Instructions (Signed)
YOU HAVE MUSCULOSKELETAL PAIN -FOLLOW INSTRUCTION OF WHAT WAS DISSCUSSED.   NO CHANGES   Your physician recommends that you schedule a follow-up appointment AS NEEDED.

## 2015-05-07 ENCOUNTER — Encounter: Payer: Self-pay | Admitting: Cardiology

## 2015-05-07 DIAGNOSIS — R0789 Other chest pain: Secondary | ICD-10-CM | POA: Insufficient documentation

## 2015-05-07 NOTE — Assessment & Plan Note (Addendum)
I recommended stretching exercises and potentially trying to sleep more flattened on one side versus the other.  OK to use NSAIDs or Tylenol for analgesic Rx.  Mostly, she was seeking reassurance as to the nature of her chest pain.

## 2015-05-07 NOTE — Progress Notes (Addendum)
PATIENT: Cristina Anderson MRN: [id number] DOB: 1976/02/15 PCP: No PCP Per Patient  Clinic Note: Chief Complaint  Patient presents with  . New Evaluation    pt c/o sparatic chest pain, feels like stabbing/wants to know if something wrong with heart  . Dizziness    pt states may be stress related    HPI: Cristina Anderson is a 39 y.o. female with a PMH below who presents today for second opinion about episodic Chest Pain. She is a former smoker, but no significant cardiac RFs.  No FH of premature CAD.Marland Kitchen  Interval History: Cristina Anderson is a very pleasant healthy-appearing 39 year old woman who presents for evaluation of episodic sharp stabbing pains him on 3 different locations of her left chest. Most of the time it is underneath her left breast toward the midline., There is also locations when it is above the left breast up to the axillary line, last placed is the left lower sternal border. She describes having short fleeting episodes lasting maybe 10-15 secondsof sharp stabbing chest discomfort that occurs really usually after activity. Has not had any exertional symptoms.  The pain is at worst 6-7/10. The first episode was about 4-5 months ago when she had the chest discomfort and began to cool and clammy. It spontaneously resolved and had did not have another recurrence until a month later. She actually is very active doing exercise routinely. Most but she does cardiovascular exercise, and also does stretching and strengthening conditioning. She's been doing pushup-type exercises, not lifting weights.  Cardiovascular ROS: positive for - chest pain negative for - dyspnea on exertion, irregular heartbeat, orthopnea, palpitations, paroxysmal nocturnal dyspnea, rapid heart rate, shortness of breath or syncope/near-syncope , TIA/amaurosis fugax  :  Past Medical History  Diagnosis Date  . Scarlet fever     Prior Cardiac Evaluation and Past Surgical History: Past Surgical History  Procedure Laterality  Date  . Back surgery      lumbar repair    Allergies  Allergen Reactions  . Mobic [Meloxicam] Swelling    Extreme joint swelling CAN TAKE IBUPROPHEN  . Red Dye Anaphylaxis, Hives and Swelling    Hand and feet swelling    Current Outpatient Prescriptions  Medication Sig Dispense Refill  . acetaminophen (TYLENOL) 500 MG tablet Take 1,000 mg by mouth every 6 (six) hours as needed.    Marland Kitchen acidophilus (RISAQUAD) CAPS capsule Take 1 capsule by mouth daily.    Marland Kitchen BIOTIN PO Take 1 tablet by mouth daily.    . diphenhydrAMINE (BENADRYL) spray Apply 1 spray topically every 4 (four) hours as needed for itching.    Marland Kitchen ketorolac (TORADOL) 10 MG tablet Take 1 tablet (10 mg total) by mouth every 6 (six) hours as needed. 20 tablet 0  . MAGNESIUM PO Take 1 tablet by mouth daily.    . Multiple Vitamin (MULTIVITAMIN WITH MINERALS) TABS tablet Take 1 tablet by mouth daily.    . Multiple Vitamins-Minerals (HAIR SKIN AND NAILS FORMULA PO) Take 1 tablet by mouth daily.    Marland Kitchen oxyCODONE-acetaminophen (PERCOCET/ROXICET) 5-325 MG per tablet Take 1-2 tablets by mouth every 4 (four) hours as needed for moderate pain or severe pain. 36 tablet 0  . phenazopyridine (PYRIDIUM) 95 MG tablet Take 95 mg by mouth 3 (three) times daily as needed for pain.     No current facility-administered medications for this visit.    Social History   Social History Narrative   Very active with routine exercise.   Former smoker. She  currently [employer]    family history includes Diabetes in her father; Hypertension in her mother.  ROS: A comprehensive Review of Systems - was performed Review of Systems  Constitutional: Negative for malaise/fatigue.  HENT: Positive for nosebleeds.   Respiratory: Negative for shortness of breath.   Cardiovascular: Negative for claudication.  Gastrointestinal: Negative for blood in stool and melena.  Genitourinary: Negative for hematuria.  Musculoskeletal: Negative.   Neurological: Negative for  dizziness and weakness.  Endo/Heme/Allergies: Does not bruise/bleed easily.  All other systems reviewed and are negative.   PHYSICAL EXAM BP 118/72 mmHg  Pulse 90  Ht 5\' 4"  (1.626 m)  Wt 135 lb 11.2 oz (61.553 kg)  BMI 23.28 kg/m2 General appearance: alert, cooperative, appears stated age, no distress and well nourished, well groomed HEENT: [place]/AT, EOMI, MMM, anicteric sclera Neck: no adenopathy, no carotid bruit, no JVD, supple, symmetrical, trachea midline and thyroid not enlarged, symmetric, no tenderness/mass/nodules Lungs: clear to auscultation bilaterally, normal percussion bilaterally and Nonlabored, good air movement Heart: regular rate and rhythm, S1, S2 normal, no murmur, click, rub or gallop and normal apical impulse;  Several areas of reproducible pains - mostly along the Left lower costal margin. Abdomen: soft, non-tender; bowel sounds normal; no masses,  no organomegaly Extremities: extremities normal, atraumatic, no cyanosis or edema Pulses: 2+ and symmetric Skin: Skin color, texture, turgor normal. No rashes or lesions Neurologic: Alert and oriented X 3, normal strength and tone. Normal symmetric reflexes. Normal coordination and gait; CN II-XII grossly intact   Adult ECG Report Not checked  Recent Labs: none available   ASSESSMENT / PLAN: Otherwise healthy young woman with no real cardiac risk factors presents with symptoms that are very consistent with musculoskeletal chest pain. There are no situations with her chest pain and any particular activity or exertion. It seems to be after exertion and may be more related to her vigorous exercise.  Problem List Items Addressed This Visit    Musculoskeletal chest pain - Primary    I recommended stretching exercises and potentially trying to sleep more flattened on one side versus the other.  OK to use NSAIDs or Tylenol for analgesic Rx.  Mostly, she was seeking reassurance as to the nature of her chest pain.          No orders of the defined types were placed in this encounter.    Followup: when necessary    Renan Danese, Piedad ClimesAVID W, M.D., M.S. Interventional Cardiologist   Pager # [phone]

## 2015-11-21 ENCOUNTER — Encounter (HOSPITAL_COMMUNITY): Payer: Self-pay | Admitting: Emergency Medicine

## 2015-11-21 ENCOUNTER — Emergency Department (HOSPITAL_COMMUNITY)
Admission: EM | Admit: 2015-11-21 | Discharge: 2015-11-21 | Disposition: A | Discharge status: Home / Self Care | Payer: BLUE CROSS/BLUE SHIELD | Attending: Emergency Medicine | Admitting: Emergency Medicine

## 2015-11-21 ENCOUNTER — Emergency Department (HOSPITAL_COMMUNITY): Payer: BLUE CROSS/BLUE SHIELD

## 2015-11-21 DIAGNOSIS — R0789 Other chest pain: Secondary | ICD-10-CM | POA: Insufficient documentation

## 2015-11-21 DIAGNOSIS — Z79899 Other long term (current) drug therapy: Secondary | ICD-10-CM | POA: Insufficient documentation

## 2015-11-21 DIAGNOSIS — Z791 Long term (current) use of non-steroidal anti-inflammatories (NSAID): Secondary | ICD-10-CM | POA: Insufficient documentation

## 2015-11-21 DIAGNOSIS — Z7982 Long term (current) use of aspirin: Secondary | ICD-10-CM | POA: Diagnosis not present

## 2015-11-21 DIAGNOSIS — Z87891 Personal history of nicotine dependence: Secondary | ICD-10-CM | POA: Diagnosis not present

## 2015-11-21 DIAGNOSIS — Z79891 Long term (current) use of opiate analgesic: Secondary | ICD-10-CM | POA: Diagnosis not present

## 2015-11-21 LAB — CBC WITH DIFFERENTIAL/PLATELET
BASOS PCT: 0 %
Basophils Absolute: 0 10*3/uL (ref 0.0–0.1)
EOS PCT: 0 %
Eosinophils Absolute: 0 10*3/uL (ref 0.0–0.7)
HEMATOCRIT: 40.8 % (ref 36.0–46.0)
Hemoglobin: 14.1 g/dL (ref 12.0–15.0)
LYMPHS ABS: 1.3 10*3/uL (ref 0.7–4.0)
Lymphocytes Relative: 10 %
MCH: 33 pg (ref 26.0–34.0)
MCHC: 34.6 g/dL (ref 30.0–36.0)
MCV: 95.6 fL (ref 78.0–100.0)
MONO ABS: 0.5 10*3/uL (ref 0.1–1.0)
MONOS PCT: 4 %
NEUTROS ABS: 11.6 10*3/uL — AB (ref 1.7–7.7)
Neutrophils Relative %: 86 %
PLATELETS: 182 10*3/uL (ref 150–400)
RBC: 4.27 MIL/uL (ref 3.87–5.11)
RDW: 12.7 % (ref 11.5–15.5)
WBC: 13.4 10*3/uL — AB (ref 4.0–10.5)

## 2015-11-21 LAB — BASIC METABOLIC PANEL
Anion gap: 9 (ref 5–15)
BUN: 13 mg/dL (ref 6–20)
CALCIUM: 9.4 mg/dL (ref 8.9–10.3)
CO2: 23 mmol/L (ref 22–32)
Chloride: 106 mmol/L (ref 101–111)
Creatinine, Ser: 0.89 mg/dL (ref 0.44–1.00)
GFR calc Af Amer: >60 mL/min (ref >60)
GLUCOSE: 109 mg/dL — AB (ref 65–99)
Potassium: 3.7 mmol/L (ref 3.5–5.1)
Sodium: 138 mmol/L (ref 135–145)

## 2015-11-21 LAB — I-STAT BETA HCG BLOOD, ED (MC, WL, AP ONLY): HCG, QUANTITATIVE: 5.6 m[IU]/mL — AB (ref <5)

## 2015-11-21 LAB — I-STAT TROPONIN, ED: Troponin i, poc: 0 ng/mL (ref 0.00–0.08)

## 2015-11-21 LAB — D-DIMER, QUANTITATIVE (NOT AT ARMC)

## 2015-11-21 MED ORDER — ONDANSETRON HCL 4 MG/2ML IJ SOLN
4.0000 mg | Freq: Once | INTRAMUSCULAR | Status: AC
  Administered 2015-11-21: 4 mg via INTRAVENOUS
  Filled 2015-11-21: qty 2

## 2015-11-21 MED ORDER — MORPHINE SULFATE (PF) 4 MG/ML IV SOLN
4.0000 mg | Freq: Once | INTRAVENOUS | Status: AC
  Administered 2015-11-21: 4 mg via INTRAVENOUS
  Filled 2015-11-21: qty 1

## 2015-11-21 MED ORDER — NAPROXEN 500 MG PO TABS: 500.0000 mg | ORAL_TABLET | Freq: Two times a day (BID) | ORAL | Status: DC

## 2015-11-21 NOTE — ED Provider Notes (Signed)
CSN: [id number]     Arrival date & time 11/21/15  1111 History   First MD Initiated Contact with Patient 11/21/15 1322     Chief Complaint  Patient presents with  . Chest Wall Pain      (Consider location/radiation/quality/duration/timing/severity/associated sxs/prior Treatment) HPI Cristina Anderson is a 40 y.o. female who comes in for evaluation of chest wall pain. Patient reports she has had sharp, shooting pains in her left chest for the past 6 months and most recently, at 9:00 this morning while bending over to pick something up out of her purse, she felt the same pain under her left breast. She also reports feeling lightheaded and dizzy at this time, but this resolved quickly. Her discomfort is exacerbated with certain movements and deep respirations. Pushing on her ribs seems to improve the discomfort. Denies any fevers, chills, rash, cough, shortness of breath, nausea or vomiting. No history of hypertension, hyperlipidemia, diabetes, family history of cardiac disease. She is a former cigarette smoker. No history of blood clot, hemoptysis, unilateral leg swelling, personal history of cancer or OCP use, recent travel or surgeries.  Past Medical History  Diagnosis Date  . Scarlet fever    Past Surgical History  Procedure Laterality Date  . Back surgery      lumbar repair   Family History  Problem Relation Age of Onset  . Diabetes Father   . Hypertension Mother    Social History  Substance Use Topics  . Smoking status: Former Smoker    Types: Cigarettes, E-cigarettes    Quit date: 05/07/2015  . Smokeless tobacco: None  . Alcohol Use: 1.2 oz/week    2 Standard drinks or equivalent per week   OB History    Gravida Para Term Preterm AB TAB SAB Ectopic Multiple Living   Review of Systems A 10 point review of systems was completed and was negative except for pertinent positives and negatives as mentioned in the history of present illness     Allergies    Mobic and Red dye  Home Medications   Prior to Admission medications   Medication Sig Start Date End Date Taking? Authorizing Provider  aspirin 325 MG tablet Take 325 mg by mouth once.   Yes Historical Provider, MD  Aspirin-Acetaminophen (GOODY BODY PAIN) 500-325 MG PACK Take 1 packet by mouth every 4 (four) hours as needed (for pain).   Yes Historical Provider, MD  Calcium-Magnesium-Zinc [phone] MG TABS Take 1 tablet by mouth daily.   Yes Historical Provider, MD  ferrous sulfate 325 (65 FE) MG EC tablet Take 325 mg by mouth daily with breakfast.   Yes Historical Provider, MD  traMADol-acetaminophen (ULTRACET) 37.5-325 MG tablet Take 0.5 tablets by mouth every 6 (six) hours as needed for moderate pain.   Yes Historical Provider, MD  naproxen (NAPROSYN) 500 MG tablet Take 1 tablet (500 mg total) by mouth 2 (two) times daily. 11/21/15   Korbin Mapps, PA-C   BP 130/82 mmHg  Pulse 74  Temp(Src) 97.5 F (36.4 C) (Oral)  Resp 16  SpO2 100%  LMP 11/14/2015 Physical Exam  Constitutional: She is oriented to person, place, and time. She appears well-developed and well-nourished.  HENT:  Head: Normocephalic and atraumatic.  Mouth/Throat: Oropharynx is clear and moist.  Eyes: Conjunctivae are normal. Pupils are equal, round, and reactive to light. Right eye exhibits no discharge. Left eye exhibits no discharge. No scleral icterus.  Neck: Neck supple.  Cardiovascular: Normal rate, regular rhythm and normal heart sounds.   Pulmonary/Chest: Effort normal and breath sounds normal. No respiratory distress. She has no wheezes. She has no rales.    Discomfort is replicated with palpation of the intercostal space below left breast. The rash, crepitus or other deformities noted.  Abdominal: Soft. There is no tenderness.  Musculoskeletal: She exhibits no tenderness.  Neurological: She is alert and oriented to person, place, and time.  Cranial Nerves II-XII grossly intact  Skin: Skin is warm and  dry. No rash noted.  Psychiatric: She has a normal mood and affect.  Nursing note and vitals reviewed.   ED Course  Procedures (including critical care time) Labs Review Labs Reviewed  CBC WITH DIFFERENTIAL/PLATELET - Abnormal; Notable for the following:    WBC 13.4 (*)    Neutro Abs 11.6 (*)    All other components within normal limits  BASIC METABOLIC PANEL - Abnormal; Notable for the following:    Glucose, Bld 109 (*)    All other components within normal limits  I-STAT BETA HCG BLOOD, ED ([facility], [facility], [facility] ONLY) - Abnormal; Notable for the following:    I-stat hCG, quantitative 5.6 (*)    All other components within normal limits  D-DIMER, QUANTITATIVE (NOT AT [facility])  Rosezena Sensor, ED    Imaging Review Dg Chest 2 View  11/21/2015  CLINICAL DATA:  Acute left-sided chest pain. EXAM: CHEST  2 VIEW COMPARISON:  May 27, 2014. FINDINGS: The heart size and mediastinal contours are within normal limits. Both lungs are clear. No pneumothorax or pleural effusion is noted. The visualized skeletal structures are unremarkable. IMPRESSION: No active cardiopulmonary disease. Electronically Signed   By: Lupita Raider, M.D.   On: 11/21/2015 14:19   I have personally reviewed and evaluated these images and lab results as part of my medical decision-making.   EKG Interpretation None     ED ECG REPORT   Date: 11/22/2015  Rate: 79  Rhythm: normal sinus rhythm  QRS Axis: normal  Intervals: normal  ST/T Wave abnormalities: nonspecific T wave changes  Conduction Disutrbances:none  Narrative Interpretation:   Old EKG Reviewed: unchanged  I have personally reviewed the EKG tracing and agree with the computerized printout as noted.  Meds given in ED:  Medications  morphine 4 MG/ML injection 4 mg (4 mg Intravenous Given 11/21/15 1448)  ondansetron (ZOFRAN) injection 4 mg (4 mg Intravenous Given 11/21/15 1457)    New Prescriptions   NAPROXEN (NAPROSYN) 500 MG TABLET    Take 1 tablet  (500 mg total) by mouth 2 (two) times daily.   Filed Vitals:   11/21/15 1117 11/21/15 1122  BP:  130/82  Pulse:  74  Temp:  97.5 F (36.4 C)  TempSrc:  Oral  Resp:  16  SpO2: 100% 100%    MDM  Cristina Anderson is a 40 y.o. female who presents for chest wall discomfort ongoing for the past 6 months. Constant since 9 AM this morning. On arrival, she is hemodynamically stable and afebrile. Symptoms are very atypical and not concerning for ACS. Heart score 2. She is PERC negative. Troponin is negative, I do not feel this needs to be repeated. EKG is reassuring. Chest x-ray with no acute cardiopulmonary pathology. D-dimer ordered in triage is negative. Pregnancy test is just slightly positive at 5.6, recommended follow-up with PCP in 48 hours for repeat. Nonspecific leukocytosis. No evidence of infection Labs are otherwise unremarkable. Discomfort is replicated  with palpation of intercostal spaces under left breast. Suspect symptoms are likely secondary to musculoskeletal etiology. Will initiate NSAID trial. Patient states she has a cardiologist she would be appropriate follow-up with as well as her PCP this week. Discussed strict return precautions. Final diagnoses:  Chest wall pain        Joycie PeekBenjamin Jontavious Commons, PA-C 11/22/15 [id number]  Leta BaptistEmily Roe Nguyen, MD 11/24/15 [phone]

## 2015-11-21 NOTE — ED Notes (Signed)
Per EMS, pt from work c/o chest wall pain under left breast that worsens with movement and coughing. Denies radiation, N/V. Episodes occur with stress.

## 2015-11-21 NOTE — ED Provider Notes (Signed)
MSE was initiated and I personally evaluated the patient and placed orders (if any) at  1:45 PM on Nov 21, 2015.  Cristina Anderson is a 40 y.o. female brought in by ambulance, who presents to the Emergency Department complaining of sudden onset, constant, waxing and waning, sharp and stabbing, 8/10, non-radiating, chest pain underneath left breast that began at 9:30 AM this morning (approximately 4 hours ago). Pt states that she was bending down to pick something out of her purse when she felt immediate pain to her chest. The pain is exacerbated with bending, laying down, and deep inspirations. Boss at work is present with pt. She reports that pt was pale and diaphoretic when the pain came on. Pt also felt lightheaded and dizzy, prompting her to call EMS. Pt took a 325 mg Aspirin prior to EMS arrival without relief. She reports having similar symptoms in the past but that it has never been this severe. She also states having diarrhea today but attributes it to having pizza last night. No recent travel, surgery, or immobilization. No hx or fhx DVT/PE. Pt is not on birth control. Denies nausea, vomiting, shortness of breath, fever, abdominal pain, constipation, wheezing, cough, leg swelling, dysuria, hematuria, weakness, numbness, tingling, or any other associated symptoms. Pt is former e-cigarette smoker. No fhx cardiac issues.   BP 130/82 mmHg  Pulse 74  Temp(Src) 97.5 F (36.4 C) (Oral)  Resp 16  SpO2 100%  LMP 11/14/2015 On exam, no chest wall tenderness, RRR, nl s1/s2, no m/r/g, distal pulses intact, no pedal edema; clear lung sounds. No tachycardia or hypoxia.  Given story of diaphoresis, lightheadedness, and chest pain, will obtain cardiac work up and move her to the acute beds. Pt took ASA prior to arrival, will order morphine for pain. Will hold on NTG until she is assessed by the next provider.  The patient appears stable so that the remainder of the MSE may be completed by another  provider.  France RavensMercedes Camprubi-Soms, PA-C 11/21/15 1346  Richardean Canalavid H Yao, MD 11/21/15 [phone]

## 2015-11-21 NOTE — Discharge Instructions (Signed)
There does not appear to be an emergent cause for your symptoms at this time. Please take your medications as prescribed to help with your discomfort. Your exam, EKG, x-ray and labs are all reassuring. Please follow-up with your doctor in the next 1-2 days for reevaluation. Return to ED for any new or worsening symptoms as we discussed.  Chest Wall Pain Chest wall pain is pain in or around the bones and muscles of your chest. Sometimes, an injury causes this pain. Sometimes, the cause may not be known. This pain may take several weeks or longer to get better. HOME CARE INSTRUCTIONS  Pay attention to any changes in your symptoms. Take these actions to help with your pain:   Rest as told by your health care provider.   Avoid activities that cause pain. These include any activities that use your chest muscles or your abdominal and side muscles to lift heavy items.   If directed, apply ice to the painful area:  Put ice in a plastic bag.  Place a towel between your skin and the bag.  Leave the ice on for 20 minutes, 2-3 times per day.  Take over-the-counter and prescription medicines only as told by your health care provider.  Do not use tobacco products, including cigarettes, chewing tobacco, and e-cigarettes. If you need help quitting, ask your health care provider.  Keep all follow-up visits as told by your health care provider. This is important. SEEK MEDICAL CARE IF:  You have a fever.  Your chest pain becomes worse.  You have new symptoms. SEEK IMMEDIATE MEDICAL CARE IF:  You have nausea or vomiting.  You feel sweaty or light-headed.  You have a cough with phlegm (sputum) or you cough up blood.  You develop shortness of breath.   This information is not intended to replace advice given to you by your health care provider. Make sure you discuss any questions you have with your health care provider.   Document Released: 06/25/2005 Document Revised: 03/16/2015 Document  Reviewed: 09/20/2014 Elsevier Interactive Patient Education 2016 [employer]  Nonspecific Chest Pain  Chest pain can be caused by many different conditions. There is always a chance that your pain could be related to something serious, such as a heart attack or a blood clot in your lungs. Chest pain can also be caused by conditions that are not life-threatening. If you have chest pain, it is very important to follow up with your health care provider. CAUSES  Chest pain can be caused by:  Heartburn.  Pneumonia or bronchitis.  Anxiety or stress.  Inflammation around your heart (pericarditis) or lung (pleuritis or pleurisy).  A blood clot in your lung.  A collapsed lung (pneumothorax). It can develop suddenly on its own (spontaneous pneumothorax) or from trauma to the chest.  Shingles infection (varicella-zoster virus).  Heart attack.  Damage to the bones, muscles, and cartilage that make up your chest wall. This can include:  Bruised bones due to injury.  Strained muscles or cartilage due to frequent or repeated coughing or overwork.  Fracture to one or more ribs.  Sore cartilage due to inflammation (costochondritis). RISK FACTORS  Risk factors for chest pain may include:  Activities that increase your risk for trauma or injury to your chest.  Respiratory infections or conditions that cause frequent coughing.  Medical conditions or overeating that can cause heartburn.  Heart disease or family history of heart disease.  Conditions or health behaviors that increase your risk of developing a blood  clot.  Having had chicken pox (varicella zoster). SIGNS AND SYMPTOMS Chest pain can feel like:  Burning or tingling on the surface of your chest or deep in your chest.  Crushing, pressure, aching, or squeezing pain.  Dull or sharp pain that is worse when you move, cough, or take a deep breath.  Pain that is also felt in your back, neck, shoulder, or arm, or pain that  spreads to any of these areas. Your chest pain may come and go, or it may stay constant. DIAGNOSIS Lab tests or other studies may be needed to find the cause of your pain. Your health care provider may have you take a test called an ambulatory ECG (electrocardiogram). An ECG records your heartbeat patterns at the time the test is performed. You may also have other tests, such as:  Transthoracic echocardiogram (TTE). During echocardiography, sound waves are used to create a picture of all of the heart structures and to look at how blood flows through your heart.  Transesophageal echocardiogram (TEE).This is a more advanced imaging test that obtains images from inside your body. It allows your health care provider to see your heart in finer detail.  Cardiac monitoring. This allows your health care provider to monitor your heart rate and rhythm in real time.  Holter monitor. This is a portable device that records your heartbeat and can help to diagnose abnormal heartbeats. It allows your health care provider to track your heart activity for several days, if needed.  Stress tests. These can be done through exercise or by taking medicine that makes your heart beat more quickly.  Blood tests.  Imaging tests. TREATMENT  Your treatment depends on what is causing your chest pain. Treatment may include:  Medicines. These may include:  Acid blockers for heartburn.  Anti-inflammatory medicine.  Pain medicine for inflammatory conditions.  Antibiotic medicine, if an infection is present.  Medicines to dissolve blood clots.  Medicines to treat coronary artery disease.  Supportive care for conditions that do not require medicines. This may include:  Resting.  Applying heat or cold packs to injured areas.  Limiting activities until pain decreases. HOME CARE INSTRUCTIONS  If you were prescribed an antibiotic medicine, finish it all even if you start to feel better.  Avoid any activities  that bring on chest pain.  Do not use any tobacco products, including cigarettes, chewing tobacco, or electronic cigarettes. If you need help quitting, ask your health care provider.  Do not drink alcohol.  Take medicines only as directed by your health care provider.  Keep all follow-up visits as directed by your health care provider. This is important. This includes any further testing if your chest pain does not go away.  If heartburn is the cause for your chest pain, you may be told to keep your head raised (elevated) while sleeping. This reduces the chance that acid will go from your stomach into your esophagus.  Make lifestyle changes as directed by your health care provider. These may include:  Getting regular exercise. Ask your health care provider to suggest some activities that are safe for you.  Eating a heart-healthy diet. A registered dietitian can help you to learn healthy eating options.  Maintaining a healthy weight.  Managing diabetes, if necessary.  Reducing stress. SEEK MEDICAL CARE IF:  Your chest pain does not go away after treatment.  You have a rash with blisters on your chest.  You have a fever. SEEK IMMEDIATE MEDICAL CARE IF:   Your  chest pain is worse.  You have an increasing cough, or you cough up blood.  You have severe abdominal pain.  You have severe weakness.  You faint.  You have chills.  You have sudden, unexplained chest discomfort.  You have sudden, unexplained discomfort in your arms, back, neck, or jaw.  You have shortness of breath at any time.  You suddenly start to sweat, or your skin gets clammy.  You feel nauseous or you vomit.  You suddenly feel light-headed or dizzy.  Your heart begins to beat quickly, or it feels like it is skipping beats. These symptoms may represent a serious problem that is an emergency. Do not wait to see if the symptoms will go away. Get medical help right away. Call your local emergency  services (911 in the [place] Do not drive yourself to the hospital.   This information is not intended to replace advice given to you by your health care provider. Make sure you discuss any questions you have with your health care provider.   Document Released: 04/04/2005 Document Revised: 07/16/2014 Document Reviewed: 01/29/2014 Elsevier Interactive Patient Education [employer]

## 2015-11-21 NOTE — ED Notes (Signed)
Pt reports pain the left rib cage area that hurts more with movement and decreases with splinting.  Pt was seen at her PCP last week and was told her blood work and EKG were normal.

## 2016-06-09 IMAGING — US US TRANSVAGINAL NON-OB
1 series · 13 of 25 positions shown · non-contrast
Comparison: CT of the abdomen and pelvis 01/23/2015

CLINICAL DATA: Hemoperitoneum detected on CT of the abdomen and
pelvis. Right pelvic pain for 2 days. Unknown LMP.

EXAM:
TRANSABDOMINAL AND TRANSVAGINAL ULTRASOUND OF PELVIS
TECHNIQUE: Both transabdominal and transvaginal ultrasound examinations of the
pelvis were performed. Transabdominal technique was performed for
global imaging of the pelvis including uterus, ovaries, adnexal
regions, and pelvic cul-de-sac. It was necessary to proceed with
endovaginal exam following the transabdominal exam to visualize the
endometrium and ovaries.

[Series 1: us transvaginal non-ob · 0.21mm/px · 69 acquisitions, 13 frames shown]
[im 1/69]
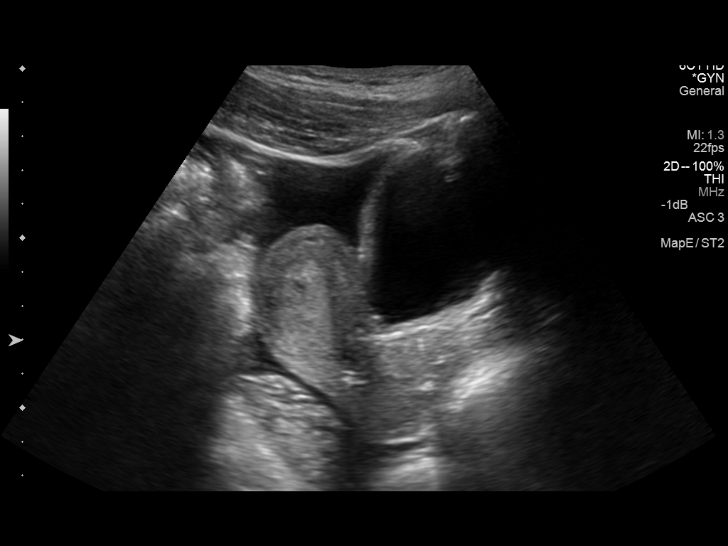
[im 6/69]
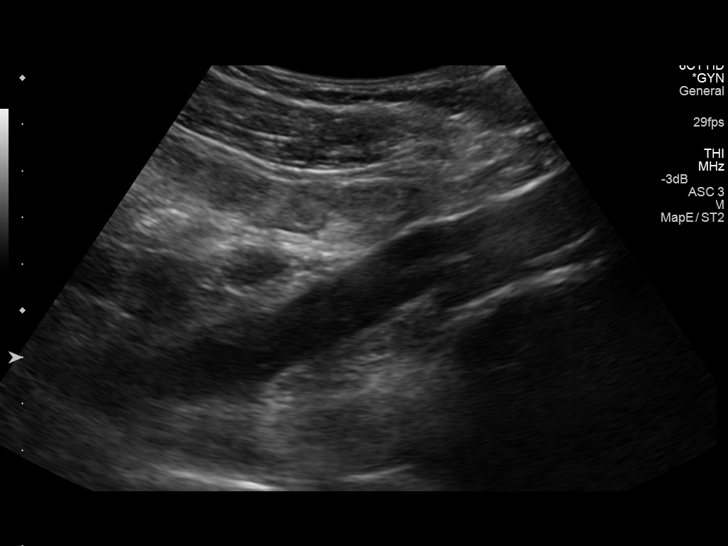
[im 12/69]
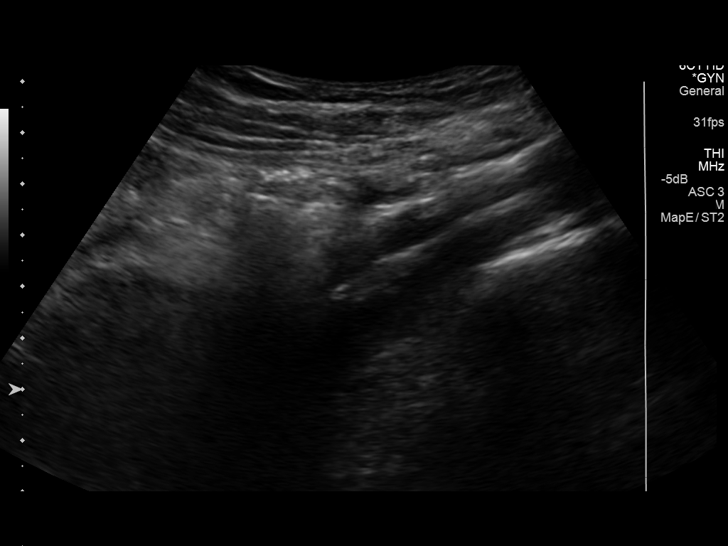
[im 18/69]
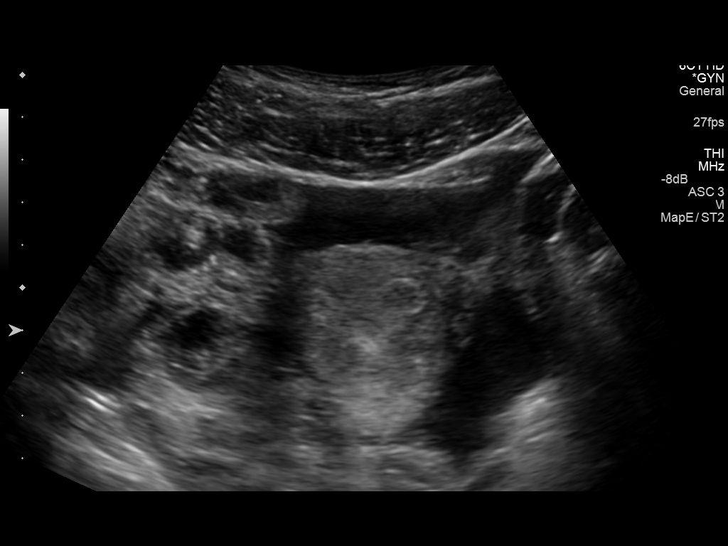
[im 23/69]
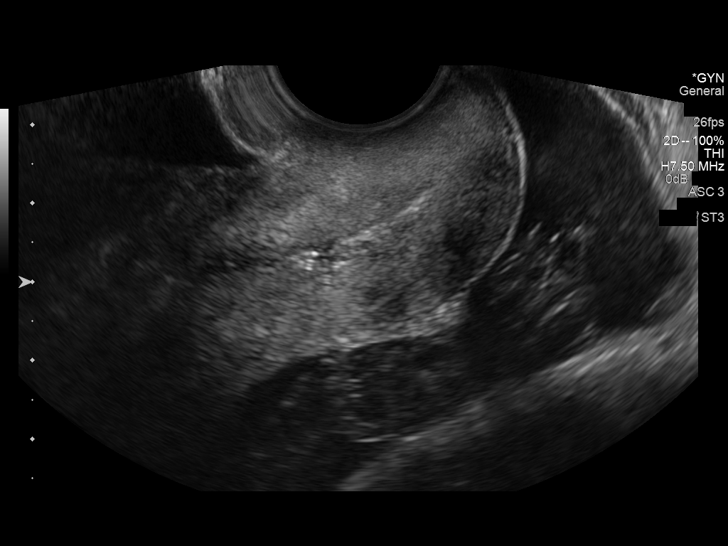
[im 29/69]
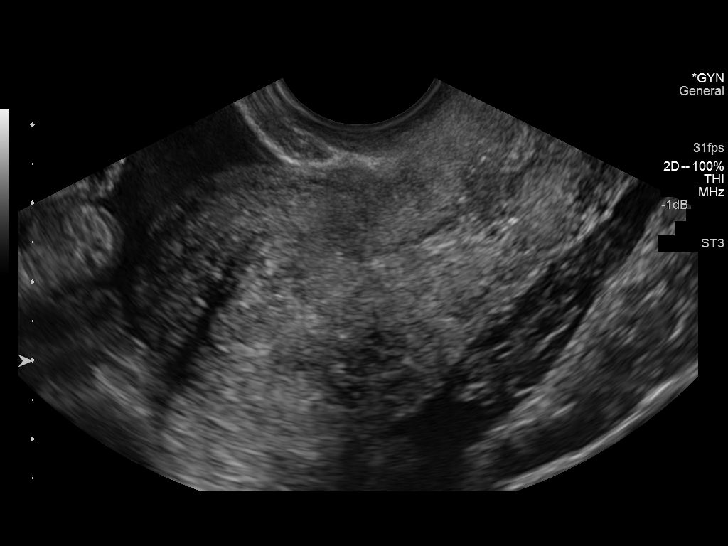
[im 35/69]
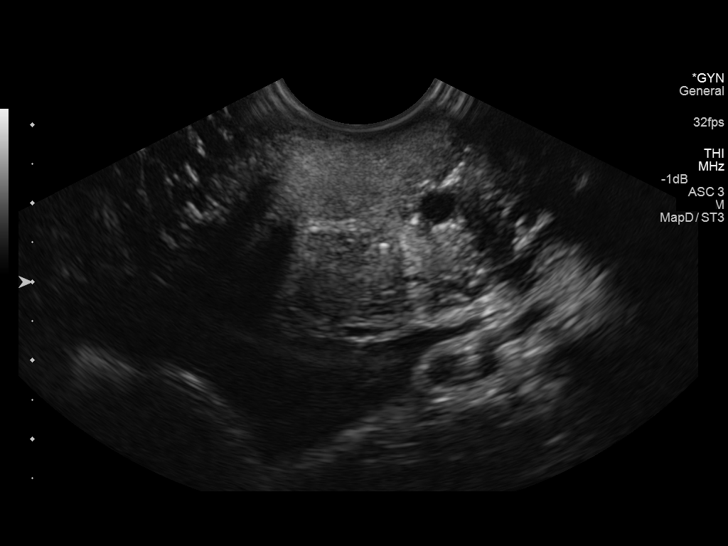
[im 40/69]
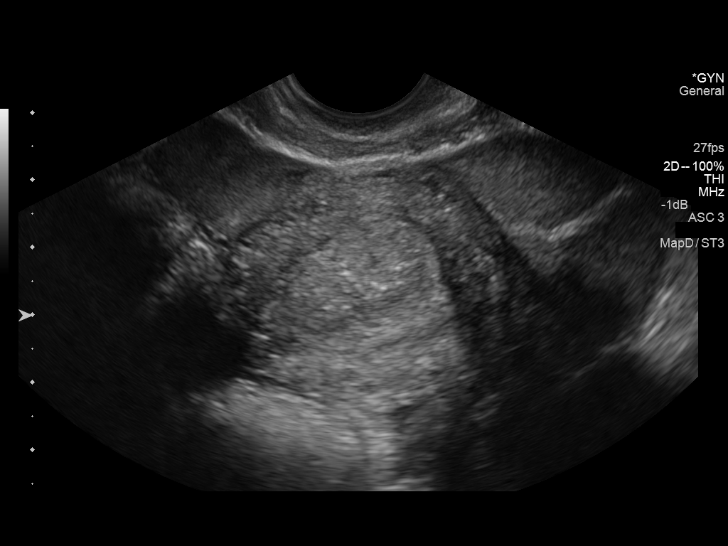
[im 46/69]
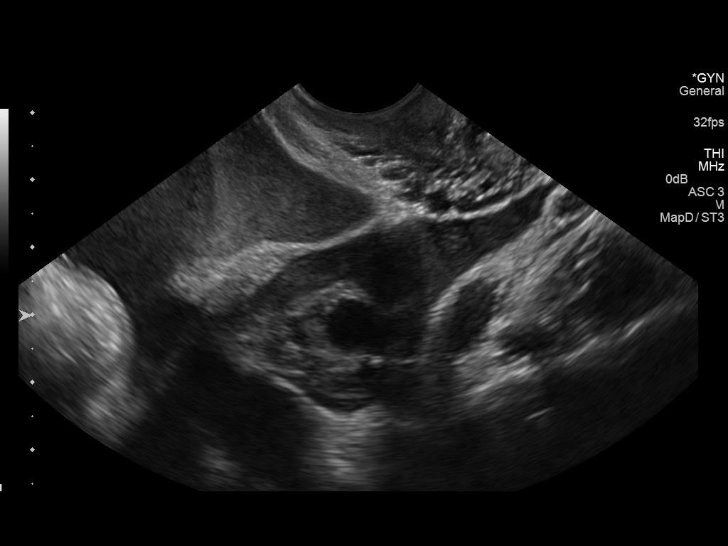
[im 52/69]
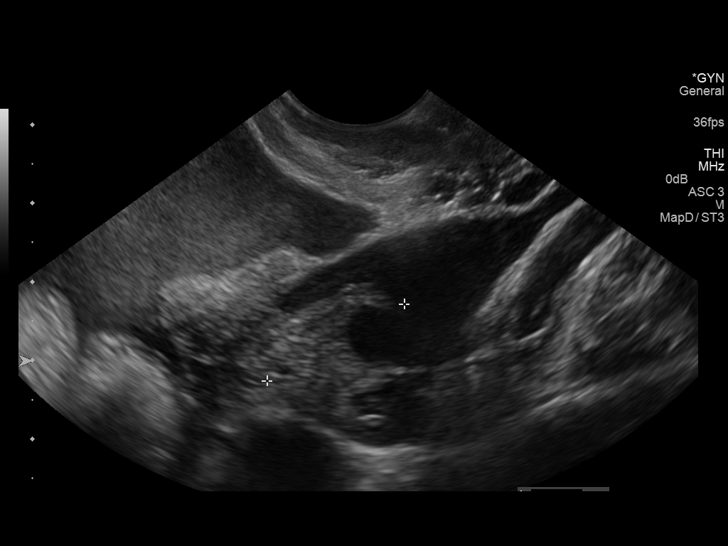
[im 57/69]
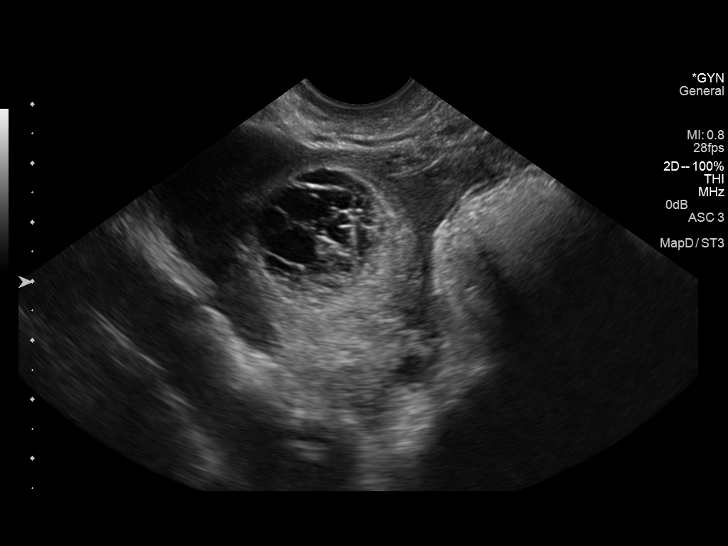
[im 63/69]
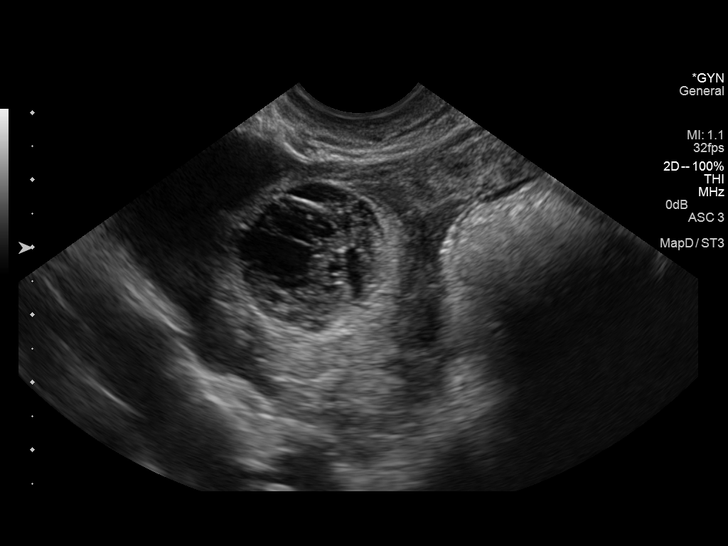
[im 69/69]
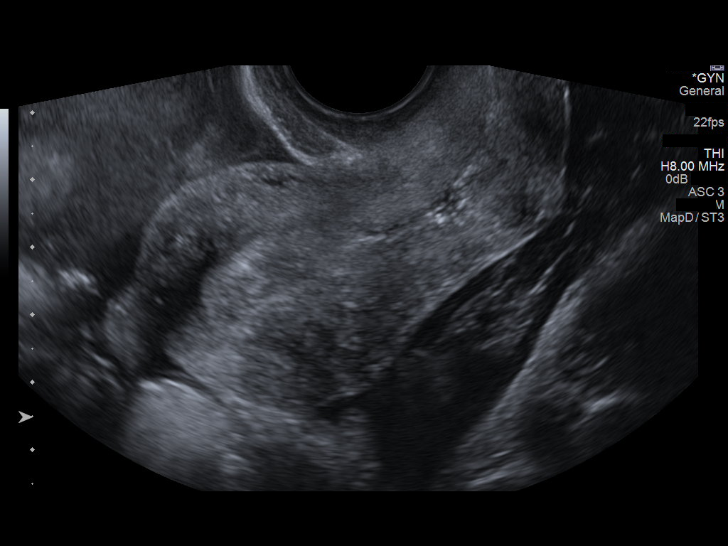

[13 of 25 positions shown; findings below may reference images not displayed]

FINDINGS: Uterus

Measurements: 7.2 x 3.6 x 4.3 cm. No fibroids or other mass
visualized. Small nabothian cysts are present.

Endometrium

Thickness: 14 mm.  Heterogeneous without discrete focal lesion.

Right ovary

Measurements: Complex scratched of 4.6 x 2.7 x 3.1 cm. A complex
cystic mass in the right ovary measures 2.3 x 2.4 x 2.2 cm. There
are numerous reticular echoes and septations within this primarily
cystic mass. No internal blood flow identified within the mass.
Hemorrhagic cyst is favored..

Left ovary

Measurements: 3.0 x 1.8 x 2.0 cm. Normal appearance/no adnexal mass.

Other findings

Moderate large amount of free pelvic fluid. Fluid contains
homogeneous internal echoes, consistent with pneumoperitoneum.
IMPRESSION: 1. Complex right ovarian mass measures 2.4 cm. The appearance favors
a benign hemorrhagic cyst which likely accounts for the moderate
hemoperitoneum.
2. Followup ultrasound is recommended in 8-12 weeks.
3. Left ovary has a normal appearance.
4. Mildly heterogeneous endometrium without focal mass.

## 2017-09-21 IMAGING — CR DG CHEST 2V
2 series · 2 of 2 positions shown · non-contrast
Comparison: May 27, 2014.

CLINICAL DATA: Acute left-sided chest pain.

EXAM:
CHEST  2 VIEW

[w chest pa]
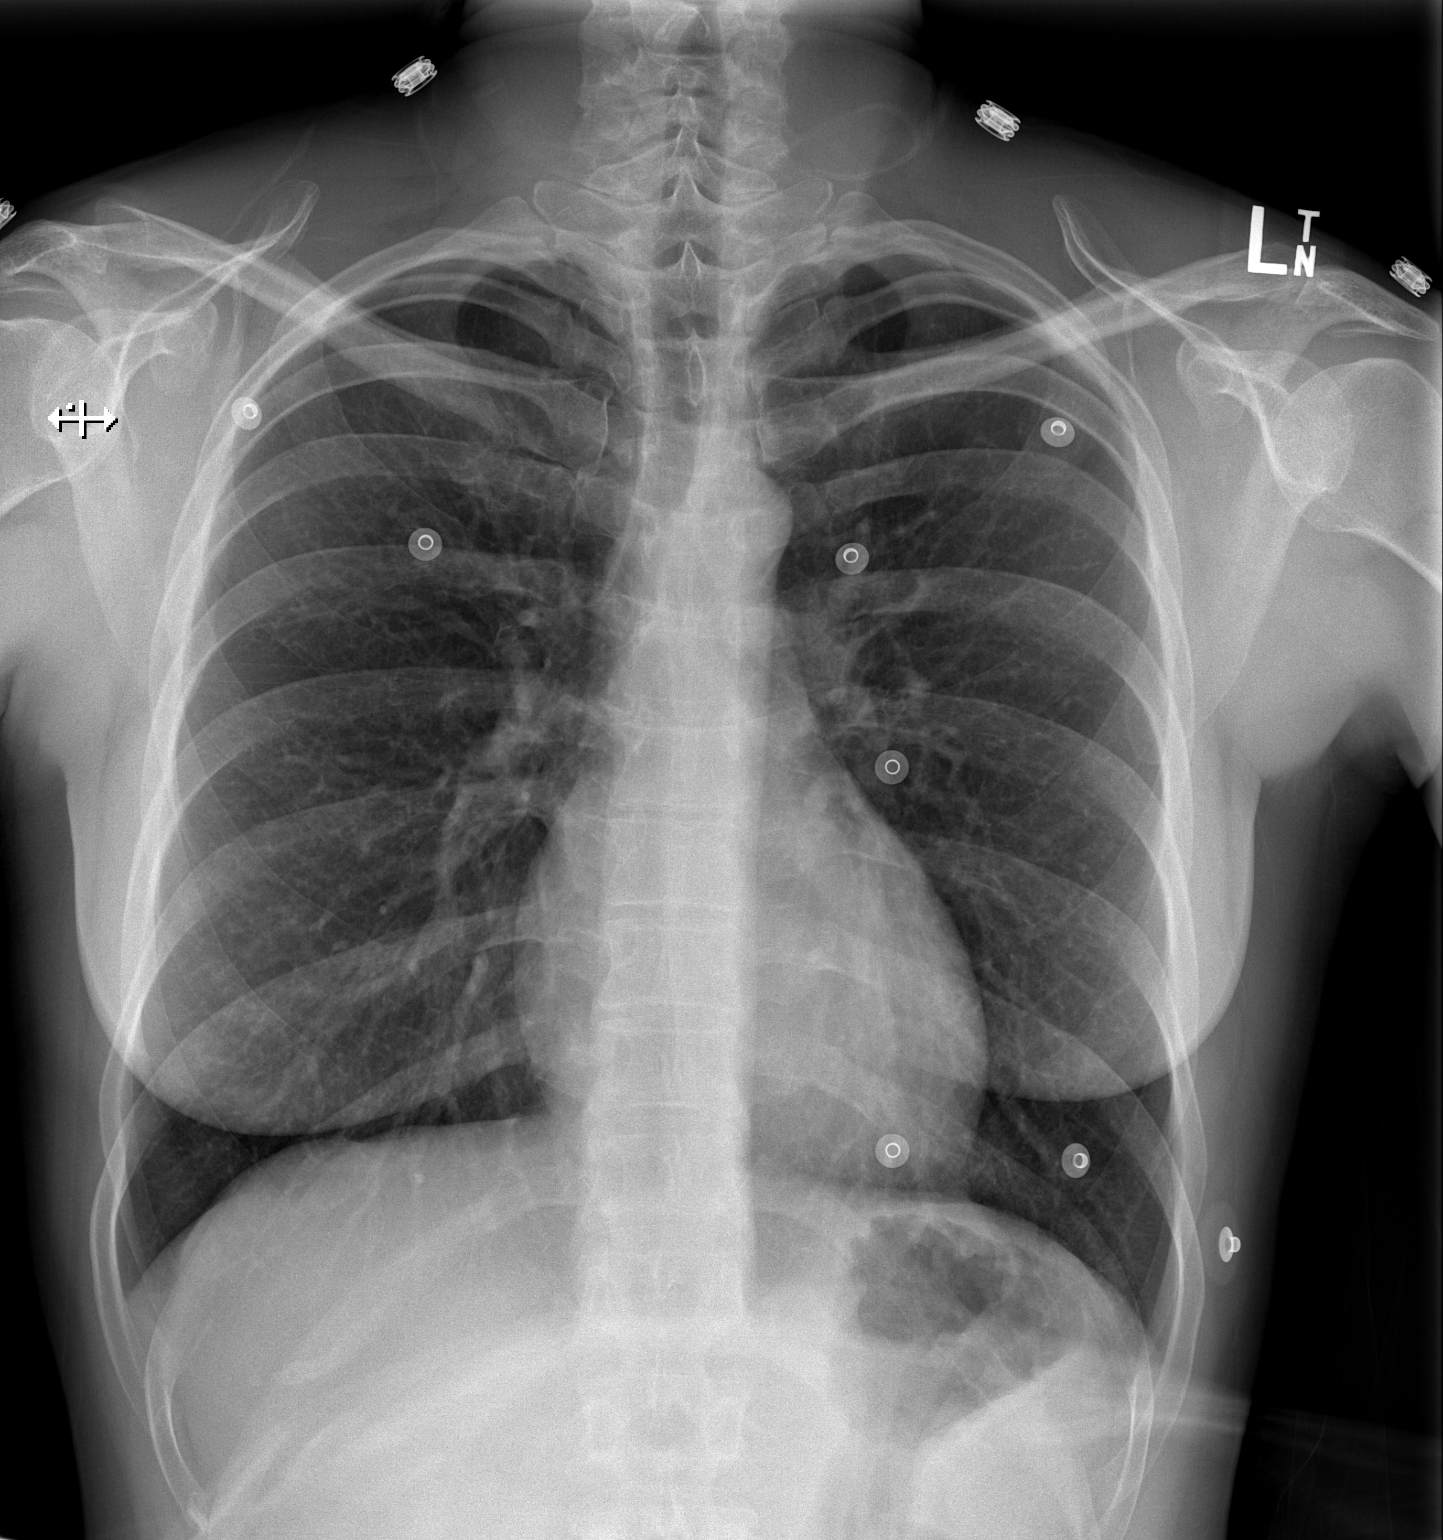

[w chest lat]
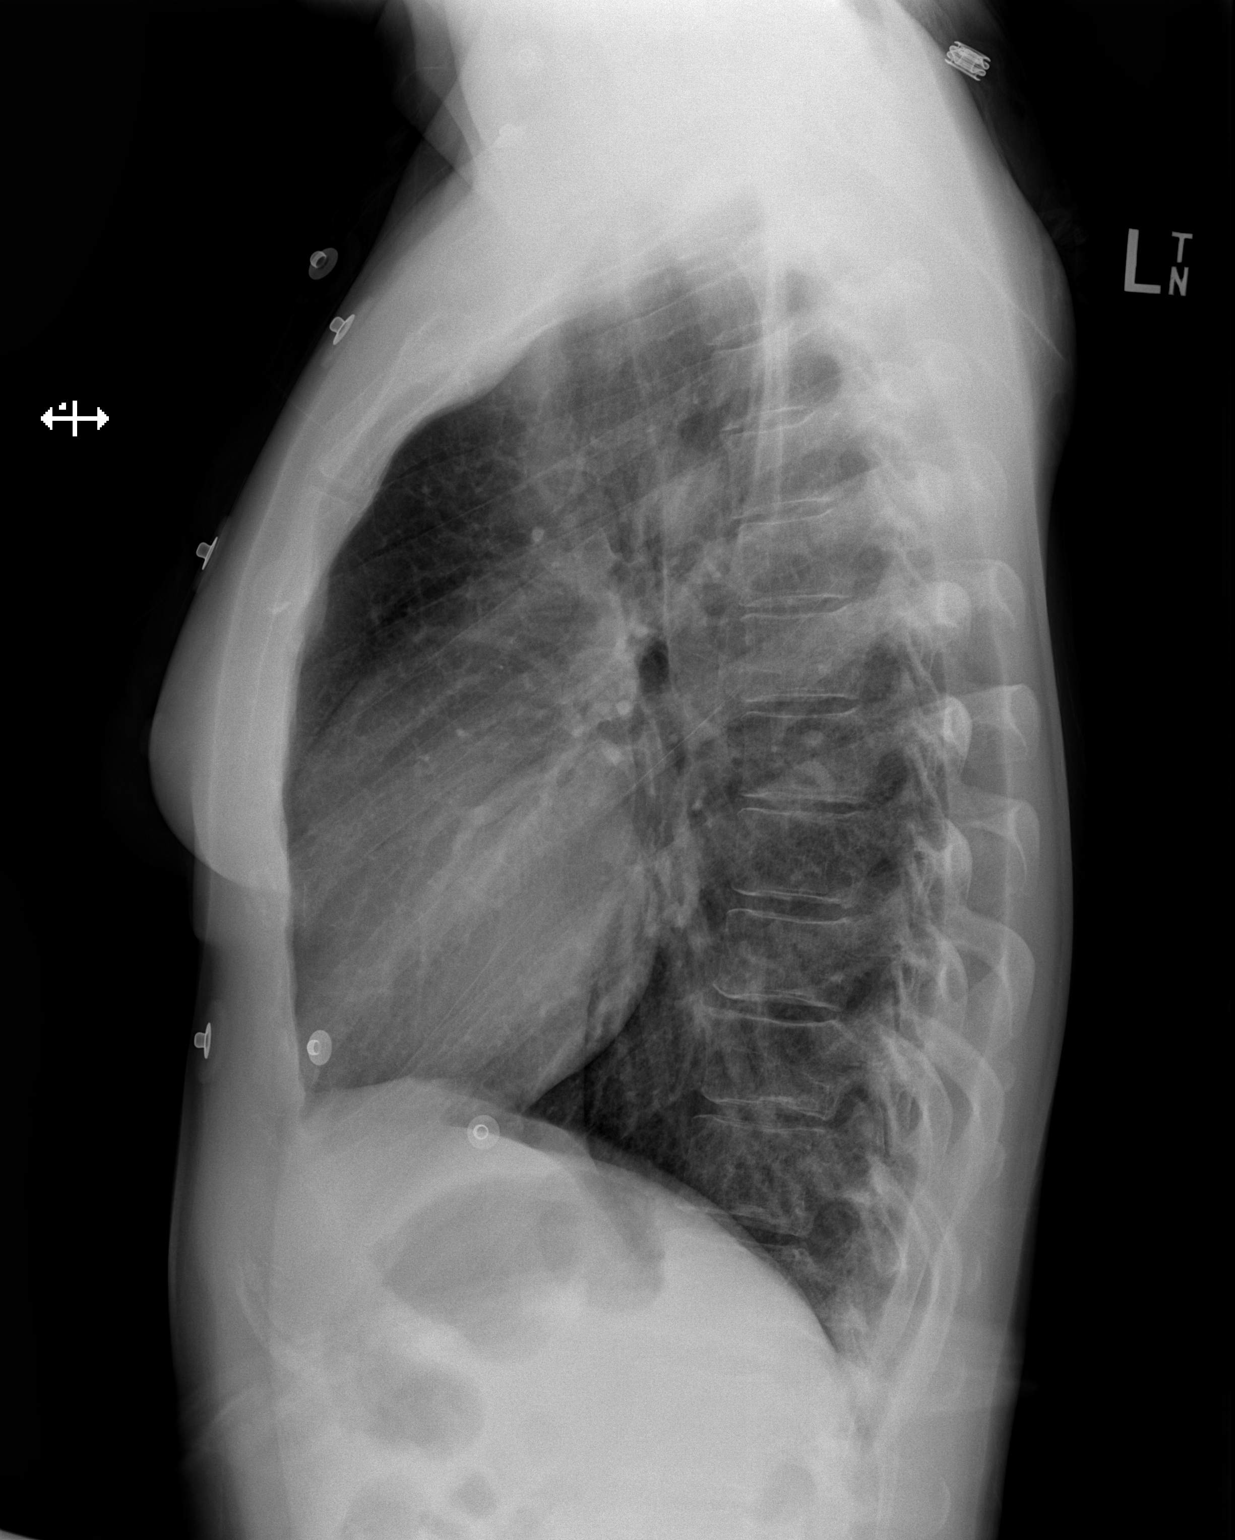

[2 of 2 positions shown; findings below may reference images not displayed]

FINDINGS: The heart size and mediastinal contours are within normal limits.
Both lungs are clear. No pneumothorax or pleural effusion is noted.
The visualized skeletal structures are unremarkable.
IMPRESSION: No active cardiopulmonary disease.

## 2019-08-04 DIAGNOSIS — M7542 Impingement syndrome of left shoulder: Secondary | ICD-10-CM | POA: Insufficient documentation

## 2020-03-04 ENCOUNTER — Ambulatory Visit (INDEPENDENT_AMBULATORY_CARE_PROVIDER_SITE_OTHER): Payer: BC Managed Care – PPO | Admitting: Sports Medicine

## 2020-03-04 ENCOUNTER — Other Ambulatory Visit: Payer: Self-pay

## 2020-03-04 ENCOUNTER — Encounter: Payer: Self-pay | Admitting: Sports Medicine

## 2020-03-04 ENCOUNTER — Ambulatory Visit (INDEPENDENT_AMBULATORY_CARE_PROVIDER_SITE_OTHER): Payer: BC Managed Care – PPO

## 2020-03-04 ENCOUNTER — Other Ambulatory Visit: Payer: Self-pay | Admitting: Sports Medicine

## 2020-03-04 DIAGNOSIS — M216X2 Other acquired deformities of left foot: Secondary | ICD-10-CM

## 2020-03-04 DIAGNOSIS — M7662 Achilles tendinitis, left leg: Secondary | ICD-10-CM

## 2020-03-04 DIAGNOSIS — M722 Plantar fascial fibromatosis: Secondary | ICD-10-CM

## 2020-03-04 DIAGNOSIS — M79672 Pain in left foot: Secondary | ICD-10-CM | POA: Diagnosis not present

## 2020-03-04 MED ORDER — METHYLPREDNISOLONE 4 MG PO TBPK: ORAL_TABLET | ORAL | 0 refills | Status: DC

## 2020-03-04 MED ORDER — TRIAMCINOLONE ACETONIDE 10 MG/ML IJ SUSP
10.0000 mg | Freq: Once | INTRAMUSCULAR | Status: AC
  Administered 2020-03-04: 10 mg

## 2020-03-04 NOTE — Patient Instructions (Signed)

## 2020-03-04 NOTE — Progress Notes (Signed)
Subjective: Cristina Anderson is a 44 y.o. female patient presents to office with complaint of moderate heel pain on the left.  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  Reports that the pain has started to change and radiate to the Achilles area.  Patient reports that pain is worse after major activities 8 out of 10 throbbing in nature with stabbing to the bottom of the heel.  Patient reports that she has tried Ace wrap, icing, stretching, good supportive shoes and over-the-counter insoles that actually made her pain worse.  Patient denies any trauma or injury.  Denies any other pedal complaints.    Review of Systems  All other systems reviewed and are negative.   Patient Active Problem List   Diagnosis Date Noted  . Musculoskeletal chest pain 05/07/2015  . Hemorrhagic cyst of ovary--right side 01/23/15 02/10/2015  . Pneumoperitoneum on [place] 01/23/15 02/10/2015    No current outpatient medications on file prior to visit.   No current facility-administered medications on file prior to visit.    Allergies  Allergen Reactions  . Mobic [Meloxicam] Swelling    Extreme joint swelling CAN TAKE IBUPROPHEN  . Red Dye Anaphylaxis, Hives and Swelling    Hand and feet swelling    Objective: Physical Exam General: The patient is alert and oriented x3 in no acute distress.  Dermatology: Skin is warm, dry and supple bilateral lower extremities. Nails 1-10 are normal. There is no erythema, edema, no eccymosis, no open lesions present. Integument is otherwise unremarkable.  Vascular: Dorsalis Pedis pulse and Posterior Tibial pulse are 2/4 bilateral. Capillary fill time is immediate to all digits.  Neurological: Grossly intact to light touch with an achilles reflex of +2/5 and a  negative Tinel's sign bilateral.  Musculoskeletal: There is moderate tenderness to palpation at the medial calcaneal tubercale and through the insertion of the plantar fascia on the left foot as well  as mild pain along the insertion of the left Achilles. No pain with compression of calcaneus bilateral. No pain with tuning fork to calcaneus bilateral. No pain with calf compression bilateral.  There is no palpable dell.  Achilles is intact.  There is decreased Ankle joint range of motion bilateral. All other joints range of motion within normal limits bilateral. Strength 5/5 in all groups bilateral.   Gait: Unassisted, Antalgic avoid weight on left heel  Xray,Left foot:  Normal osseous mineralization. Joint spaces preserved. No fracture/dislocation/boney destruction. Calcaneal spur present with mild thickening of plantar fascia. No other soft tissue abnormalities or radiopaque foreign bodies.   Assessment and Plan: Problem List Items Addressed This Visit    None    Visit Diagnoses    Plantar fasciitis of left foot    -  Primary   Tendonitis, Achilles, left       Acquired equinus deformity of left foot       Pain of left heel          -Complete examination performed.  -Xrays reviewed -Discussed with patient in detail the condition of plantar fasciitis, how this occurs and general treatment options. Explained both conservative and surgical treatments.  -After oral consent and aseptic prep, injected a mixture containing 1 ml of 2%  plain lidocaine, 1 ml 0.5% plain marcaine, 0.5 ml of kenalog 10 and 0.5 ml of dexamethasone phosphate into left heel. Post-injection care discussed with patient.  -Rx  Medrol dose pack to take if pain is not completely relieved after 1 week -Recommended good  supportive shoes and advised use of plantar fascial brace as dispensed at today's visit and advised patient at future visits we will further discuss tennis shoes and insoles/orthotics -Explained and dispensed to patient daily stretching exercises. -Recommend patient to ice affected area 1-2x daily. -Patient to return to office in 3-4 weeks for follow up or sooner if problems or questions arise.  Cristina Anderson, DPM

## 2020-03-07 ENCOUNTER — Other Ambulatory Visit: Payer: Self-pay | Admitting: Sports Medicine

## 2020-03-07 DIAGNOSIS — M722 Plantar fascial fibromatosis: Secondary | ICD-10-CM

## 2020-04-01 ENCOUNTER — Other Ambulatory Visit: Payer: Self-pay

## 2020-04-01 ENCOUNTER — Ambulatory Visit (INDEPENDENT_AMBULATORY_CARE_PROVIDER_SITE_OTHER): Payer: BC Managed Care – PPO | Admitting: Sports Medicine

## 2020-04-01 ENCOUNTER — Encounter: Payer: Self-pay | Admitting: Sports Medicine

## 2020-04-01 DIAGNOSIS — M79672 Pain in left foot: Secondary | ICD-10-CM

## 2020-04-01 DIAGNOSIS — M216X2 Other acquired deformities of left foot: Secondary | ICD-10-CM

## 2020-04-01 DIAGNOSIS — M722 Plantar fascial fibromatosis: Secondary | ICD-10-CM

## 2020-04-01 DIAGNOSIS — M7662 Achilles tendinitis, left leg: Secondary | ICD-10-CM

## 2020-04-01 NOTE — Progress Notes (Signed)
Subjective: Cristina Anderson is a 44 y.o. female patient returns to office for follow up of left heel pain. Reports that pain feels better the brace and shot helps but heel feels weak on the lateral side of the heel.  Denies any other pedal complaints.   Patient Active Problem List   Diagnosis Date Noted  . Impingement syndrome of left shoulder region 08/04/2019  . Musculoskeletal chest pain 05/07/2015  . Hemorrhagic cyst of ovary--right side 01/23/15 02/10/2015  . Pneumoperitoneum on [place] 01/23/15 02/10/2015    Current Outpatient Medications on File Prior to Visit  Medication Sig Dispense Refill  . methylPREDNISolone (MEDROL DOSEPAK) 4 MG TBPK tablet Take as directed 21 tablet 0   No current facility-administered medications on file prior to visit.    Allergies  Allergen Reactions  . Mobic [Meloxicam] Swelling    Extreme joint swelling CAN TAKE IBUPROPHEN  . Red Dye Anaphylaxis, Hives and Swelling    Hand and feet swelling    Objective: Physical Exam General: The patient is alert and oriented x3 in no acute distress.  Dermatology: Skin is warm, dry and supple bilateral lower extremities. Nails 1-10 are normal. There is no erythema, edema, no eccymosis, no open lesions present. Integument is otherwise unremarkable.  Vascular: Dorsalis Pedis pulse and Posterior Tibial pulse are 2/4 bilateral. Capillary fill time is immediate to all digits.  Neurological: Grossly intact to light touch bilateral.  Musculoskeletal: There is minimaltenderness to palpation at the medial calcaneal tubercale and through the insertion of the plantar fascia on the left foot as well as mild pain along the insertion of the left Achilles with pinpoint tingling at lateral heel on left, No pain with compression of calcaneus bilateral. No pain with tuning fork to calcaneus bilateral. No pain with calf compression bilateral.  There is no palpable dell.  Achilles is intact.  There is decreased Ankle joint range of  motion bilateral. All other joints range of motion within normal limits bilateral. Strength 5/5 in all groups bilateral.    Assessment and Plan: Problem List Items Addressed This Visit    None    Visit Diagnoses    Plantar fasciitis of left foot    -  Primary   Tendonitis, Achilles, left       Acquired equinus deformity of left foot       Pain of left heel          -Complete examination performed.  -Re-discussed with patient in detail the condition of plantar fasciitis and achilles tendonitis left -Continue with plantar fascial brace may wean from brace after 3 weeks -Recommend gentle stretching and use of night splint that she already has as tolerated -Recommend patient to ice affected area 1-2x daily. -May slowly increase activities to tolerance -Recommend patient if pain worsens to take Medrol that she has at home -Recommend good supportive shoes; shoe list provided  -Patient to return to office in 3-4 weeks for follow up or sooner if problems or questions arise.  [name], DPM

## 2021-01-28 ENCOUNTER — Other Ambulatory Visit: Payer: Self-pay | Admitting: Chiropractic Medicine

## 2021-01-28 DIAGNOSIS — M2560 Stiffness of unspecified joint, not elsewhere classified: Secondary | ICD-10-CM

## 2021-01-28 DIAGNOSIS — M542 Cervicalgia: Secondary | ICD-10-CM

## 2021-01-28 DIAGNOSIS — M6281 Muscle weakness (generalized): Secondary | ICD-10-CM

## 2021-09-28 ENCOUNTER — Ambulatory Visit (HOSPITAL_BASED_OUTPATIENT_CLINIC_OR_DEPARTMENT_OTHER)
Admission: RE | Admit: 2021-09-28 | Payer: BC Managed Care – PPO | Source: Ambulatory Visit | Admitting: Orthopedic Surgery

## 2021-09-28 ENCOUNTER — Encounter (HOSPITAL_BASED_OUTPATIENT_CLINIC_OR_DEPARTMENT_OTHER): Admission: RE | Payer: Self-pay | Source: Ambulatory Visit

## 2021-09-28 SURGERY — EXCISION, GANGLION CYST, WRIST
Anesthesia: Regional | Site: Wrist | Laterality: Left

## 2021-09-29 ENCOUNTER — Other Ambulatory Visit: Payer: Self-pay | Admitting: Orthopedic Surgery

## 2022-06-15 ENCOUNTER — Ambulatory Visit: Payer: BC Managed Care – PPO | Admitting: Neurology

## 2022-07-16 ENCOUNTER — Telehealth: Payer: Self-pay | Admitting: Neurology

## 2022-07-16 NOTE — Telephone Encounter (Signed)
Pt is calling. Stated she would like to re-schedule MRI. Pt is requesting a call back.

## 2022-08-22 ENCOUNTER — Ambulatory Visit (INDEPENDENT_AMBULATORY_CARE_PROVIDER_SITE_OTHER): Payer: BC Managed Care – PPO | Admitting: Neurology

## 2022-08-22 ENCOUNTER — Other Ambulatory Visit: Payer: Self-pay | Admitting: Neurology

## 2022-08-22 ENCOUNTER — Encounter: Payer: Self-pay | Admitting: Neurology

## 2022-08-22 VITALS — BP 127/79 | HR 75 | Ht 64.0 in | Wt 158.0 lb

## 2022-08-22 DIAGNOSIS — G8929 Other chronic pain: Secondary | ICD-10-CM

## 2022-08-22 DIAGNOSIS — G43109 Migraine with aura, not intractable, without status migrainosus: Secondary | ICD-10-CM

## 2022-08-22 DIAGNOSIS — G43009 Migraine without aura, not intractable, without status migrainosus: Secondary | ICD-10-CM | POA: Diagnosis not present

## 2022-08-22 DIAGNOSIS — R258 Other abnormal involuntary movements: Secondary | ICD-10-CM

## 2022-08-22 DIAGNOSIS — R292 Abnormal reflex: Secondary | ICD-10-CM

## 2022-08-22 DIAGNOSIS — M2480 Other specific joint derangements of unspecified joint, not elsewhere classified: Secondary | ICD-10-CM

## 2022-08-22 DIAGNOSIS — R29898 Other symptoms and signs involving the musculoskeletal system: Secondary | ICD-10-CM

## 2022-08-22 DIAGNOSIS — M4722 Other spondylosis with radiculopathy, cervical region: Secondary | ICD-10-CM

## 2022-08-22 DIAGNOSIS — R519 Headache, unspecified: Secondary | ICD-10-CM

## 2022-08-22 DIAGNOSIS — M542 Cervicalgia: Secondary | ICD-10-CM

## 2022-08-22 DIAGNOSIS — R51 Headache with orthostatic component, not elsewhere classified: Secondary | ICD-10-CM

## 2022-08-22 DIAGNOSIS — H539 Unspecified visual disturbance: Secondary | ICD-10-CM

## 2022-08-22 DIAGNOSIS — M5382 Other specified dorsopathies, cervical region: Secondary | ICD-10-CM

## 2022-08-22 MED ORDER — ONDANSETRON 4 MG PO TBDP: 4.0000 mg | ORAL_TABLET | Freq: Three times a day (TID) | ORAL | 3 refills | Status: AC | PRN

## 2022-08-22 MED ORDER — NURTEC 75 MG PO TBDP: 75.0000 mg | ORAL_TABLET | Freq: Every day | ORAL | 11 refills | Status: DC | PRN

## 2022-08-22 MED ORDER — RIZATRIPTAN BENZOATE 10 MG PO TBDP: 10.0000 mg | ORAL_TABLET | ORAL | 11 refills | Status: AC | PRN

## 2022-08-22 NOTE — Patient Instructions (Addendum)
[email or web address] [facility] and if you like it we can refer to PT for neck pain 2. Right at the onset of migraine: Nurtec as needed for migraine. Once daily 3. Right at the onset of migraine:  Rizatriptan with 51m Ondansetron.then can repeat in 2 hours if needed. Max twice daily. 4. MRI of the brain and MRI of the cervical spine 5. Follow up video in 6 months   There is increased risk for stroke in women with migraine with aura and a contraindication for the combined contraceptive pill for use by women who have migraine with aura. The risk for women with migraine without aura is lower. However other risk factors like smoking are far more likely to increase stroke risk than migraine. There is a recommendation for no smoking and for the use of OCPs without estrogen such as progestogen only pills particularly for women with migraine with aura..Marland KitchenPeople who have migraine headaches with auras may be 3 times more likely to have a stroke caused by a blood clot, compared to migraine patients who don't see auras. Women who take hormone-replacement therapy may be 30 percent more likely to suffer a clot-based stroke than women not taking medication containing estrogen. Other risk factors like smoking and high blood pressure may be  much more important.   Rizatriptan Disintegrating Tablets What is this medication? RIZATRIPTAN (rye za TRIP tan) treats migraines. It works by blocking pain signals and narrowing blood vessels in the brain. It belongs to a group of medications called triptans. It is not used to prevent migraines. This medicine may be used for other purposes; ask your health care provider or pharmacist if you have questions. COMMON BRAND NAME(S): Maxalt-MLT What should I tell my care team before I take this medication? They need to know if you have any of these conditions: Circulation problems in fingers and toes Diabetes Heart disease High blood pressure High cholesterol History of irregular  heartbeat History of stroke Stomach or intestine problems Tobacco use An unusual or allergic reaction to rizatriptan, other medications, foods, dyes, or preservatives Pregnant or trying to get pregnant Breast-feeding How should I use this medication? Take this medication by mouth. Take it as directed on the prescription label. You do not need water to take this medication. Leave the tablet in the sealed pack until you are ready to take it. With dry hands, open the pack and gently remove the tablet. If the tablet breaks or crumbles, throw it away. Use a new tablet. Place the tablet on the tongue and allow it to dissolve. Then, swallow it. Do not cut, crush, or chew this medication. Do not use it more often than directed. Talk to your care team about the use of this medication in children. While it may be prescribed for children as young as 6 years for selected conditions, precautions do apply. Overdosage: If you think you have taken too much of this medicine contact a poison control center or emergency room at once. NOTE: This medicine is only for you. Do not share this medicine with others. What if I miss a dose? This does not apply. This medication is not for regular use. What may interact with this medication? Do not take this medication with any of the following: Ergot alkaloids, such as dihydroergotamine, ergotamine MAOIs, such as Marplan, Nardil, Parnate Other medications for migraine headache, such as almotriptan, eletriptan, frovatriptan, naratriptan, sumatriptan, zolmitriptan This medication may also interact with the following: Certain medications for depression, anxiety, or other mental health conditions Propranolol  This list may not describe all possible interactions. Give your health care provider a list of all the medicines, herbs, non-prescription drugs, or dietary supplements you use. Also tell them if you smoke, drink alcohol, or use illegal drugs. Some items may interact with  your medicine. What should I watch for while using this medication? Visit your care team for regular checks on your progress. Tell your care team if your symptoms do not start to get better or if they get worse. This medication may affect your coordination, reaction time, or judgment. Do not drive or operate machinery until you know how this medication affects you. Sit up or stand slowly to reduce the risk of dizzy or fainting spells. If you take migraine medications for 10 or more days a month, your migraines may get worse. Keep a diary of headache days and medication use. Contact your care team if your migraine attacks occur more frequently. What side effects may I notice from receiving this medication? Side effects that you should report to your care team as soon as possible: Allergic reactions--skin rash, itching, hives, swelling of the face, lips, tongue, or throat Burning, pain, tingling, or color changes in the hands, arms, legs, or feet Heart attack--pain or tightness in the chest, shoulders, arms, or jaw, nausea, shortness of breath, cold or clammy skin, feeling faint or lightheaded Heart rhythm changes--fast or irregular heartbeat, dizziness, feeling faint or lightheaded, chest pain, trouble breathing Increase in blood pressure Irritability, confusion, fast or irregular heartbeat, muscle stiffness, twitching muscles, sweating, high fever, seizure, chills, vomiting, diarrhea, which may be signs of serotonin syndrome Raynaud syndrome--cool, numb, or painful fingers or toes that may change color from pale, to blue, to red Seizures Stroke--sudden numbness or weakness of the face, arm, or leg, trouble speaking, confusion, trouble walking, loss of balance or coordination, dizziness, severe headache, change in vision Sudden or severe stomach pain, bloody diarrhea, fever, nausea, vomiting Vision loss Side effects that usually do not require medical attention (report to your care team if they  continue or are bothersome): Dizziness Unusual weakness or fatigue This list may not describe all possible side effects. Call your doctor for medical advice about side effects. You may report side effects to FDA at 1-800-FDA-1088. Where should I keep my medication? Keep out of the reach of children and pets. Store at room temperature between 15 and 30 degrees C (59 and 86 degrees F). Protect from light and moisture. Get rid of any unused medication after the expiration date. To get rid of medications that are no longer needed or have expired: Take the medication to a medication take-back program. Check with your pharmacy or law enforcement to find a location. If you cannot return the medication, check the label or package insert to see if the medication should be thrown out in the garbage or flushed down the toilet. If you are not sure, ask your care team. If it is safe to put it in the trash, empty the medication out of the container. Mix the medication with cat litter, dirt, coffee grounds, or other unwanted substance. Seal the mixture in a bag or container. Put it in the trash. NOTE: This sheet is a summary. It may not cover all possible information. If you have questions about this medicine, talk to your doctor, pharmacist, or health care provider.  2023 Elsevier/Gold Standard (2021-10-26 00:00:00) Ondansetron Dissolving Tablets What is this medication? ONDANSETRON (on DAN se tron) prevents nausea and vomiting from chemotherapy, radiation, or surgery. It  works by blocking substances in the body that may cause nausea or vomiting. It belongs to a group of medications called antiemetics. This medicine may be used for other purposes; ask your health care provider or pharmacist if you have questions. COMMON BRAND NAME(S): Zofran ODT What should I tell my care team before I take this medication? They need to know if you have any of these conditions: Heart disease History of irregular  heartbeat Liver disease Low levels of magnesium or potassium in the blood An unusual or allergic reaction to ondansetron, granisetron, other medications, foods, dyes, or preservatives Pregnant or trying to get pregnant Breast-feeding How should I use this medication? These tablets are made to dissolve in the mouth. Do not try to push the tablet through the foil backing. With dry hands, peel away the foil backing and gently remove the tablet. Place the tablet in the mouth and allow it to dissolve, then swallow. While you may take these tablets with water, it is not necessary to do so. Talk to your care team regarding the use of this medication in children. Special care may be needed. Overdosage: If you think you have taken too much of this medicine contact a poison control center or emergency room at once. NOTE: This medicine is only for you. Do not share this medicine with others. What if I miss a dose? If you miss a dose, take it as soon as you can. If it is almost time for your next dose, take only that dose. Do not take double or extra doses. What may interact with this medication? Do not take this medication with any of the following: Apomorphine Certain medications for fungal infections like fluconazole, itraconazole, ketoconazole, posaconazole, voriconazole Cisapride Dronedarone Pimozide Thioridazine This medication may also interact with the following: Carbamazepine Certain medications for depression, anxiety, or psychotic disturbances Fentanyl Linezolid MAOIs like Carbex, Eldepryl, Marplan, Nardil, and Parnate Methylene blue (injected into a vein) Other medications that prolong the QT interval (cause an abnormal heart rhythm) like dofetilide, ziprasidone Phenytoin Rifampicin Tramadol This list may not describe all possible interactions. Give your health care provider a list of all the medicines, herbs, non-prescription drugs, or dietary supplements you use. Also tell them if  you smoke, drink alcohol, or use illegal drugs. Some items may interact with your medicine. What should I watch for while using this medication? Check with your care team as soon as you can if you have any sign of an allergic reaction. What side effects may I notice from receiving this medication? Side effects that you should report to your care team as soon as possible: Allergic reactions--skin rash, itching, hives, swelling of the face, lips, tongue, or throat Bowel blockage--stomach cramping, unable to have a bowel movement or pass gas, loss of appetite, vomiting Chest pain (angina)--pain, pressure, or tightness in the chest, neck, back, or arms Heart rhythm changes--fast or irregular heartbeat, dizziness, feeling faint or lightheaded, chest pain, trouble breathing Irritability, confusion, fast or irregular heartbeat, muscle stiffness, twitching muscles, sweating, high fever, seizure, chills, vomiting, diarrhea, which may be signs of serotonin syndrome Side effects that usually do not require medical attention (report to your care team if they continue or are bothersome): Constipation Diarrhea General discomfort and fatigue Headache This list may not describe all possible side effects. Call your doctor for medical advice about side effects. You may report side effects to FDA at 1-800-FDA-1088. Where should I keep my medication? Keep out of the reach of children and pets. Store  between 2 and 30 degrees C (36 and 86 degrees F). Throw away any unused medication after the expiration date. NOTE: This sheet is a summary. It may not cover all possible information. If you have questions about this medicine, talk to your doctor, pharmacist, or health care provider.  2023 Elsevier/Gold Standard (2007-08-16 00:00:00) Rimegepant Disintegrating Tablets What is this medication? RIMEGEPANT (ri ME je pant) prevents and treats migraines. It works by blocking a substance in the body that causes  migraines. This medicine may be used for other purposes; ask your health care provider or pharmacist if you have questions. COMMON BRAND NAME(S): NURTEC ODT What should I tell my care team before I take this medication? They need to know if you have any of these conditions: Kidney disease Liver disease An unusual or allergic reaction to rimegepant, other medications, foods, dyes, or preservatives Pregnant or trying to get pregnant Breast-feeding How should I use this medication? Take this medication by mouth. Take it as directed on the prescription label. Leave the tablet in the sealed pack until you are ready to take it. With dry hands, open the pack and gently remove the tablet. If the tablet breaks or crumbles, throw it away. Use a new tablet. Place the tablet in the mouth and allow it to dissolve. Then, swallow it. Do not cut, crush, or chew this medication. You do not need water to take this medication. Talk to your care team about the use of this medication in children. Special care may be needed. Overdosage: If you think you have taken too much of this medicine contact a poison control center or emergency room at once. NOTE: This medicine is only for you. Do not share this medicine with others. What if I miss a dose? This does not apply. This medication is not for regular use. What may interact with this medication? Certain medications for fungal infections, such as fluconazole, itraconazole Rifampin This list may not describe all possible interactions. Give your health care provider a list of all the medicines, herbs, non-prescription drugs, or dietary supplements you use. Also tell them if you smoke, drink alcohol, or use illegal drugs. Some items may interact with your medicine. What should I watch for while using this medication? Visit your care team for regular checks on your progress. Tell your care team if your symptoms do not start to get better or if they get worse. What side  effects may I notice from receiving this medication? Side effects that you should report to your care team as soon as possible: Allergic reactions--skin rash, itching, hives, swelling of the face, lips, tongue, or throat Side effects that usually do not require medical attention (report to your care team if they continue or are bothersome): Nausea Stomach pain This list may not describe all possible side effects. Call your doctor for medical advice about side effects. You may report side effects to FDA at 1-800-FDA-1088. Where should I keep my medication? Keep out of the reach of children and pets. Store at room temperature between 20 and 25 degrees C (68 and 77 degrees F). Get rid of any unused medication after the expiration date. To get rid of medications that are no longer needed or have expired: Take the medication to a medication take-back program. Check with your pharmacy or law enforcement to find a location. If you cannot return the medication, check the label or package insert to see if the medication should be thrown out in the garbage or flushed down  the toilet. If you are not sure, ask your care team. If it is safe to put it in the trash, take the medication out of the container. Mix the medication with cat litter, dirt, coffee grounds, or other unwanted substance. Seal the mixture in a bag or container. Put it in the trash. NOTE: This sheet is a summary. It may not cover all possible information. If you have questions about this medicine, talk to your doctor, pharmacist, or health care provider.  2023 Elsevier/Gold Standard (2021-07-13 00:00:00)
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
## 2022-08-23 ENCOUNTER — Telehealth: Payer: Self-pay | Admitting: Neurology

## 2022-08-23 ENCOUNTER — Other Ambulatory Visit: Payer: Self-pay | Admitting: Neurology

## 2022-08-23 DIAGNOSIS — G43109 Migraine with aura, not intractable, without status migrainosus: Secondary | ICD-10-CM

## 2022-08-23 DIAGNOSIS — G43009 Migraine without aura, not intractable, without status migrainosus: Secondary | ICD-10-CM

## 2022-08-23 NOTE — Telephone Encounter (Signed)
Pt scheduled for MR brain w/wo and MR cervical spine wo contrast at [facility] for 08/28/22 at 12:30pm  Dillon Bjork [id number] (08/22/22-09/20/22)

## 2022-08-23 NOTE — Telephone Encounter (Signed)
Pt has returned the call to schedule her MRI, please call.

## 2022-08-23 NOTE — Telephone Encounter (Signed)
See 08/23/22 phone note

## 2022-08-28 ENCOUNTER — Ambulatory Visit (INDEPENDENT_AMBULATORY_CARE_PROVIDER_SITE_OTHER): Payer: BC Managed Care – PPO

## 2022-08-28 DIAGNOSIS — M2480 Other specific joint derangements of unspecified joint, not elsewhere classified: Secondary | ICD-10-CM

## 2022-08-28 DIAGNOSIS — M5382 Other specified dorsopathies, cervical region: Secondary | ICD-10-CM

## 2022-08-28 DIAGNOSIS — R519 Headache, unspecified: Secondary | ICD-10-CM

## 2022-08-28 DIAGNOSIS — M4722 Other spondylosis with radiculopathy, cervical region: Secondary | ICD-10-CM

## 2022-08-28 DIAGNOSIS — R51 Headache with orthostatic component, not elsewhere classified: Secondary | ICD-10-CM

## 2022-08-28 DIAGNOSIS — H539 Unspecified visual disturbance: Secondary | ICD-10-CM

## 2022-08-28 DIAGNOSIS — R258 Other abnormal involuntary movements: Secondary | ICD-10-CM

## 2022-08-28 DIAGNOSIS — R29898 Other symptoms and signs involving the musculoskeletal system: Secondary | ICD-10-CM

## 2022-08-28 DIAGNOSIS — G8929 Other chronic pain: Secondary | ICD-10-CM

## 2022-08-28 DIAGNOSIS — R292 Abnormal reflex: Secondary | ICD-10-CM

## 2022-08-28 DIAGNOSIS — M542 Cervicalgia: Secondary | ICD-10-CM

## 2022-08-28 MED ORDER — GADOBENATE DIMEGLUMINE 529 MG/ML IV SOLN
15.0000 mL | Freq: Once | INTRAVENOUS | Status: AC | PRN
  Administered 2022-08-28: 15 mL via INTRAVENOUS

## 2022-08-30 ENCOUNTER — Telehealth: Payer: Self-pay | Admitting: Neurology

## 2022-08-30 NOTE — Telephone Encounter (Signed)
Please call patient.  She has some arthritic changes in the neck that could be causing pain.  However I do not see anything very serious in the spinal cord or any significant nerve root pinching but can definitely cause neck pain.  If she likes we can send her for evaluation to neurosurgery to see if injections in the neck may help with her neck pain.  I am also happy to get her an evaluation with a neurosurgeon just as a second opinion if she likes.  She can go to any neurosurgeon at [facility] if she likes, if she wants to go be evaluated for injections into her neck to see if that helps I would send her to Dr. Davy Pique at [facility] neurosurgery.

## 2022-08-30 NOTE — Telephone Encounter (Signed)
I sent pt mychart email with results and recommendation after I did not get her on phone, LMVM.

## 2022-08-30 NOTE — Telephone Encounter (Signed)
Of course, thanks  

## 2022-09-04 ENCOUNTER — Telehealth: Payer: Self-pay | Admitting: Neurology

## 2022-09-04 DIAGNOSIS — G43009 Migraine without aura, not intractable, without status migrainosus: Secondary | ICD-10-CM

## 2022-09-04 NOTE — Telephone Encounter (Signed)
[name]) following up PA for Rimegepant Sulfate (NURTEC) 75 MG TBDP

## 2022-09-04 NOTE — Telephone Encounter (Signed)
PA finished this am through [employer]

## 2022-09-11 MED ORDER — UBRELVY 100 MG PO TABS: ORAL_TABLET | ORAL | 11 refills | Status: DC

## 2022-09-11 NOTE — Addendum Note (Signed)
Addended by: Gildardo Griffes on: 09/11/2022 11:55 AM   Modules accepted: Orders

## 2022-09-11 NOTE — Telephone Encounter (Signed)
Received denial from [employer]. Formulary alternative is [place]. Reference # [id number]

## 2022-09-13 ENCOUNTER — Telehealth: Payer: Self-pay | Admitting: *Deleted

## 2022-09-13 NOTE — Telephone Encounter (Signed)
Received Roselyn Meier PA. This was started on CMM. Awaiting clinical questions. Key: [id number].

## 2022-10-24 ENCOUNTER — Encounter: Payer: Self-pay | Admitting: Neurology

## 2022-10-24 ENCOUNTER — Other Ambulatory Visit: Payer: Self-pay

## 2022-10-24 DIAGNOSIS — G43009 Migraine without aura, not intractable, without status migrainosus: Secondary | ICD-10-CM

## 2022-10-24 MED ORDER — UBRELVY 100 MG PO TABS: ORAL_TABLET | ORAL | 11 refills | Status: DC

## 2022-11-08 NOTE — Telephone Encounter (Signed)
Was able to finish PA on CMM. Awaiting determination from [employer].

## 2023-01-09 ENCOUNTER — Other Ambulatory Visit: Payer: Self-pay

## 2023-01-22 ENCOUNTER — Other Ambulatory Visit (HOSPITAL_COMMUNITY): Payer: Self-pay

## 2023-01-22 ENCOUNTER — Telehealth: Payer: Self-pay

## 2023-01-22 NOTE — Telephone Encounter (Signed)
Pharmacy Patient Advocate Encounter   Received notification from CoverMyMeds that prior authorization for Ubrelvy 100MG  tablets is required/requested.   Insurance verification completed.   The patient is insured through [facility] .   Per test claim: PA started via CoverMyMeds. KEY [id number] . Waiting for clinical questions to populate.

## 2023-01-23 NOTE — Telephone Encounter (Signed)
Clinical questions have been submitted-awaiting determination. 

## 2023-01-24 ENCOUNTER — Other Ambulatory Visit (HOSPITAL_COMMUNITY): Payer: Self-pay

## 2023-01-24 NOTE — Telephone Encounter (Addendum)
  Received a faxed form for additional information-faxed PA form to [phone]

## 2023-01-27 NOTE — Telephone Encounter (Signed)
Pharmacy Patient Advocate Encounter  Received notification from [facility] that Prior Authorization for Ubrelvy 100MG  tablets has been APPROVED from 01/25/2023 to 04/17/2023.Marland Kitchen  PA #/Case ID/Reference #: PA Case ID #: [id number]

## 2023-02-04 ENCOUNTER — Encounter: Payer: Self-pay | Admitting: Neurology

## 2023-02-04 DIAGNOSIS — G43009 Migraine without aura, not intractable, without status migrainosus: Secondary | ICD-10-CM

## 2023-02-04 DIAGNOSIS — G43109 Migraine with aura, not intractable, without status migrainosus: Secondary | ICD-10-CM

## 2023-02-05 MED ORDER — NURTEC 75 MG PO TBDP: 75.0000 mg | ORAL_TABLET | Freq: Every day | ORAL | 11 refills | Status: AC | PRN

## 2023-02-26 ENCOUNTER — Other Ambulatory Visit: Payer: Self-pay | Admitting: Student

## 2023-02-26 DIAGNOSIS — Z1231 Encounter for screening mammogram for malignant neoplasm of breast: Secondary | ICD-10-CM
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
## 2023-02-26 NOTE — Patient Instructions (Signed)
Below is our plan:  We will continue Nurtec for abortive therapy. You can add Tylenol 1000mg  and EITHER ibuprofen 800mg  OR Aleve 220mg  to the Nurtec for really bad migraines. You can also add Benadryl if at night or Xyzal/Zyrtec during the day.   We will place a referral to PT for neck pain. Consider seeing neurosurgery if it worsens.   Please make sure you are staying well hydrated. I recommend 50-60 ounces daily. Well balanced diet and regular exercise encouraged. Consistent sleep schedule with 6-8 hours recommended.   Please continue follow up with care team as directed.   Follow up with me in 6-8 months  You may receive a survey regarding today's visit. I encourage you to leave honest feed back as I do use this information to improve patient care. Thank you for seeing me today!   GENERAL HEADACHE INFORMATION:   Natural supplements: Magnesium Oxide or Magnesium Glycinate 500 mg at bed (up to 800 mg daily) Coenzyme Q10 300 mg in AM Vitamin B2- 200 mg twice a day   Add 1 supplement at a time since even natural supplements can have undesirable side effects. You can sometimes buy supplements cheaper (especially Coenzyme Q10) at www.WebmailGuide.co.za or at [facility].  Migraine with aura: There is increased risk for stroke in women with migraine with aura and a contraindication for the combined contraceptive pill for use by women who have migraine with aura. The risk for women with migraine without aura is lower. However other risk factors like smoking are far more likely to increase stroke risk than migraine. There is a recommendation for no smoking and for the use of OCPs without estrogen such as progestogen only pills particularly for women with migraine with aura.Marland Kitchen People who have migraine headaches with auras may be 3 times more likely to have a stroke caused by a blood clot, compared to migraine patients who don't see auras. Women who take hormone-replacement therapy may be 30 percent more likely to  suffer a clot-based stroke than women not taking medication containing estrogen. Other risk factors like smoking and high blood pressure may be  much more important.    Vitamins and herbs that show potential:   Magnesium: Magnesium (250 mg twice a day or 500 mg at bed) has a relaxant effect on smooth muscles such as blood vessels. Individuals suffering from frequent or daily headache usually have low magnesium levels which can be increase with daily supplementation of 400-750 mg. Three trials found 40-90% average headache reduction  when used as a preventative. Magnesium may help with headaches are aura, the best evidence for magnesium is for migraine with aura is its thought to stop the cortical spreading depression we believe is the pathophysiology of migraine aura.Magnesium also demonstrated the benefit in menstrually related migraine.  Magnesium is part of the messenger system in the serotonin cascade and it is a good muscle relaxant.  It is also useful for constipation which can be a side effect of other medications used to treat migraine. Good sources include nuts, whole grains, and tomatoes. Side Effects: loose stool/diarrhea  Riboflavin (vitamin B 2) 200 mg twice a day. This vitamin assists nerve cells in the production of ATP a principal energy storing molecule.  It is necessary for many chemical reactions in the body.  There have been at least 3 clinical trials of riboflavin using 400 mg per day all of which suggested that migraine frequency can be decreased.  All 3 trials showed significant improvement in over half  of migraine sufferers.  The supplement is found in bread, cereal, milk, meat, and poultry.  Most Americans get more riboflavin than the recommended daily allowance, however riboflavin deficiency is not necessary for the supplements to help prevent headache. Side effects: energizing, green urine   Coenzyme Q10: This is present in almost all cells in the body and is critical component for  the conversion of energy.  Recent studies have shown that a nutritional supplement of CoQ10 can reduce the frequency of migraine attacks by improving the energy production of cells as with riboflavin.  Doses of 150 mg twice a day have been shown to be effective.   Melatonin: Increasing evidence shows correlation between melatonin secretion and headache conditions.  Melatonin supplementation has decreased headache intensity and duration.  It is widely used as a sleep aid.  Sleep is natures way of dealing with migraine.  A dose of 3 mg is recommended to start for headaches including cluster headache. Higher doses up to 15 mg has been reviewed for use in Cluster headache and have been used. The rationale behind using melatonin for cluster is that many theories regarding the cause of Cluster headache center around the disruption of the normal circadian rhythm in the brain.  This helps restore the normal circadian rhythm.   HEADACHE DIET: Foods and beverages which may trigger migraine Note that only 20% of headache patients are food sensitive. You will know if you are food sensitive if you get a headache consistently 20 minutes to 2 hours after eating a certain food. Only cut out a food if it causes headaches, otherwise you might remove foods you enjoy! What matters most for diet is to eat a well balanced healthy diet full of vegetables and low fat protein, and to not miss meals.   Chocolate, other sweets ALL cheeses except cottage and cream cheese Dairy products, yogurt, sour cream, ice cream Liver Meat extracts (Bovril, Marmite, meat tenderizers) Meats or fish which have undergone aging, fermenting, pickling or smoking. These include: Hotdogs,salami,Lox,sausage, mortadellas,smoked salmon, pepperoni, Pickled herring Pods of broad bean (English beans, Chinese pea pods, [place] (fava) beans, lima and navy beans Ripe avocado, ripe banana Yeast extracts or active yeast preparations such as Brewer's or  Fleishman's (commercial bakes goods are permitted) Tomato based foods, pizza (lasagna, etc.)   MSG (monosodium glutamate) is disguised as many things; look for these common aliases: Monopotassium glutamate Autolysed yeast Hydrolysed protein Sodium caseinate "flavorings" "all natural preservatives" Nutrasweet   Avoid all other foods that convincingly provoke headaches.   Resources: The Dizzy [place]air Laundry Your Headache Diet, migrainestrong.com  https://zamora-andrews.com/   Caffeine and Migraine For patients that have migraine, caffeine intake more than 3 days per week can lead to dependency and increased migraine frequency. I would recommend cutting back on your caffeine intake as best you can. The recommended amount of caffeine is 200-300 mg daily, although migraine patients may experience dependency at even lower doses. While you may notice an increase in headache temporarily, cutting back will be helpful for headaches in the long run. For more information on caffeine and migraine, visit: https://americanmigrainefoundation.org/resource-library/caffeine-and-migraine/   Headache Prevention Strategies:   1. Maintain a headache diary; learn to identify and avoid triggers.  - This can be a simple note where you log when you had a headache, associated symptoms, and medications used - There are several smartphone apps developed to help track migraines: Migraine Buddy, Migraine Monitor, Curelator N1-Headache App   Common triggers include: Emotional triggers: Emotional/Upset family or friends  Emotional/Upset occupation Business reversal/success Anticipation anxiety Crisis-serious Post-crisis periodNew job/position   Physical triggers: Vacation Day Weekend Strenuous Exercise High Altitude Location New Move Menstrual Day Physical Illness Oversleep/Not enough sleep Weather changes Light: Photophobia or light sesnitivity treatment involves  a balance between desensitization and reduction in overly strong input. Use dark polarized glasses outside, but not inside. Avoid bright or fluorescent light, but do not dim environment to the point that going into a normally lit room hurts. Consider FL-41 tint lenses, which reduce the most irritating wavelengths without blocking too much light.  These can be obtained at axonoptics.com or theraspecs.com Foods: see list above.   2. Limit use of acute treatments (over-the-counter medications, triptans, etc.) to no more than 2 days per week or 10 days per month to prevent medication overuse headache (rebound headache).     3. Follow a regular schedule (including weekends and holidays): Don't skip meals. Eat a balanced diet. 8 hours of sleep nightly. Minimize stress. Exercise 30 minutes per day. Being overweight is associated with a 5 times increased risk of chronic migraine. Keep well hydrated and drink 6-8 glasses of water per day.   4. Initiate non-pharmacologic measures at the earliest onset of your headache. Rest and quiet environment. Relax and reduce stress. Breathe2Relax is a free app that can instruct you on    some simple relaxtion and breathing techniques. Http://Dawnbuse.com is a    free website that provides teaching videos on relaxation.  Also, there are  many apps that   can be downloaded for "mindful" relaxation.  An app called YOGA NIDRA will help walk you through mindfulness. Another app called Calm can be downloaded to give you a structured mindfulness guide with daily reminders and skill development. Headspace for guided meditation Mindfulness Based Stress Reduction Online Course: www.palousemindfulness.com Cold compresses.   5. Don't wait!! Take the maximum allowable dosage of prescribed medication at the first sign of migraine.   6. Compliance:  Take prescribed medication regularly as directed and at the first sign of a migraine.   7. Communicate:  Call your physician when  problems arise, especially if your headaches change, increase in frequency/severity, or become associated with neurological symptoms (weakness, numbness, slurred speech, etc.). Proceed to emergency room if you experience new or worsening symptoms or symptoms do not resolve, if you have new neurologic symptoms or if headache is severe, or for any concerning symptom.   8. Headache/pain management therapies: Consider various complementary methods, including medication, behavioral therapy, psychological counselling, biofeedback, massage therapy, acupuncture, dry needling, and other modalities.  Such measures may reduce the need for medications. Counseling for pain management, where patients learn to function and ignore/minimize their pain, seems to work very well.   9. Recommend changing family's attention and focus away from patient's headaches. Instead, emphasize daily activities. If first question of day is 'How are your headaches/Do you have a headache today?', then patient will constantly think about headaches, thus making them worse. Goal is to re-direct attention away from headaches, toward daily activities and other distractions.   10. Helpful Websites: www.AmericanHeadacheSociety.org PatentHood.ch www.headaches.org TightMarket.nl www.achenet.org

## 2023-02-27 ENCOUNTER — Telehealth (INDEPENDENT_AMBULATORY_CARE_PROVIDER_SITE_OTHER): Payer: BC Managed Care – PPO | Admitting: Family Medicine

## 2023-02-27 ENCOUNTER — Encounter: Payer: Self-pay | Admitting: Family Medicine

## 2023-02-27 DIAGNOSIS — M4722 Other spondylosis with radiculopathy, cervical region: Secondary | ICD-10-CM

## 2023-02-27 DIAGNOSIS — G8929 Other chronic pain: Secondary | ICD-10-CM

## 2023-02-27 DIAGNOSIS — M2480 Other specific joint derangements of unspecified joint, not elsewhere classified: Secondary | ICD-10-CM

## 2023-02-27 DIAGNOSIS — G43009 Migraine without aura, not intractable, without status migrainosus: Secondary | ICD-10-CM | POA: Diagnosis not present

## 2023-02-27 DIAGNOSIS — M542 Cervicalgia: Secondary | ICD-10-CM | POA: Diagnosis not present

## 2023-02-27 DIAGNOSIS — R519 Headache, unspecified: Secondary | ICD-10-CM

## 2023-02-28 ENCOUNTER — Telehealth: Payer: Self-pay | Admitting: Family Medicine

## 2023-02-28 NOTE — Telephone Encounter (Signed)
Referral for physical therapy fax to [facility] ([place]). Phone: [phone], Fax: [phone].

## 2023-03-01 ENCOUNTER — Ambulatory Visit
Admission: RE | Admit: 2023-03-01 | Discharge: 2023-03-01 | Disposition: A | Discharge status: Home / Self Care | Payer: BC Managed Care – PPO | Source: Ambulatory Visit | Attending: Student | Admitting: Student

## 2023-03-01 ENCOUNTER — Other Ambulatory Visit: Payer: Self-pay | Admitting: Student

## 2023-03-01 DIAGNOSIS — Z1231 Encounter for screening mammogram for malignant neoplasm of breast: Secondary | ICD-10-CM
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
## 2023-10-31 ENCOUNTER — Encounter: Payer: Self-pay | Admitting: Family Medicine

## 2023-10-31 ENCOUNTER — Telehealth: Payer: BC Managed Care – PPO | Admitting: Family Medicine

## 2023-10-31 DIAGNOSIS — G43009 Migraine without aura, not intractable, without status migrainosus: Secondary | ICD-10-CM

## 2024-03-20 ENCOUNTER — Other Ambulatory Visit: Payer: Self-pay | Admitting: Student

## 2024-03-20 DIAGNOSIS — Z1231 Encounter for screening mammogram for malignant neoplasm of breast: Secondary | ICD-10-CM

## 2024-03-27 ENCOUNTER — Inpatient Hospital Stay: Admission: RE | Admit: 2024-03-27 | Source: Ambulatory Visit

## 2024-11-09 ENCOUNTER — Ambulatory Visit: Admitting: Obstetrics
